# Patient Record
Sex: Female | Born: 1945 | Race: White | Hispanic: No | Marital: Married | Smoking: Never smoker
Health system: Southern US, Community
[De-identification: ages and names within clinical notes are randomized; demographics above are authoritative.]

## PROBLEM LIST (undated history)

## (undated) DIAGNOSIS — E8809 Other disorders of plasma-protein metabolism, not elsewhere classified: Secondary | ICD-10-CM

## (undated) DIAGNOSIS — R578 Other shock: Secondary | ICD-10-CM

## (undated) DIAGNOSIS — R740 Nonspecific elevation of levels of transaminase and lactic acid dehydrogenase [LDH]: Secondary | ICD-10-CM

## (undated) DIAGNOSIS — R7401 Elevation of levels of liver transaminase levels: Secondary | ICD-10-CM

## (undated) DIAGNOSIS — E785 Hyperlipidemia, unspecified: Secondary | ICD-10-CM

## (undated) DIAGNOSIS — I10 Essential (primary) hypertension: Secondary | ICD-10-CM

## (undated) DIAGNOSIS — E876 Hypokalemia: Secondary | ICD-10-CM

## (undated) DIAGNOSIS — R748 Abnormal levels of other serum enzymes: Secondary | ICD-10-CM

## (undated) DIAGNOSIS — Z8781 Personal history of (healed) traumatic fracture: Secondary | ICD-10-CM

## (undated) DIAGNOSIS — T7840XA Allergy, unspecified, initial encounter: Secondary | ICD-10-CM

## (undated) DIAGNOSIS — I6529 Occlusion and stenosis of unspecified carotid artery: Secondary | ICD-10-CM

## (undated) DIAGNOSIS — J45909 Unspecified asthma, uncomplicated: Secondary | ICD-10-CM

## (undated) DIAGNOSIS — B009 Herpesviral infection, unspecified: Secondary | ICD-10-CM

## (undated) DIAGNOSIS — E039 Hypothyroidism, unspecified: Secondary | ICD-10-CM

## (undated) DIAGNOSIS — M549 Dorsalgia, unspecified: Secondary | ICD-10-CM

## (undated) DIAGNOSIS — M2170 Unequal limb length (acquired), unspecified site: Secondary | ICD-10-CM

## (undated) DIAGNOSIS — M810 Age-related osteoporosis without current pathological fracture: Secondary | ICD-10-CM

## (undated) DIAGNOSIS — J449 Chronic obstructive pulmonary disease, unspecified: Secondary | ICD-10-CM

## (undated) DIAGNOSIS — C801 Malignant (primary) neoplasm, unspecified: Secondary | ICD-10-CM

## (undated) DIAGNOSIS — F419 Anxiety disorder, unspecified: Secondary | ICD-10-CM

## (undated) DIAGNOSIS — H269 Unspecified cataract: Secondary | ICD-10-CM

## (undated) DIAGNOSIS — K219 Gastro-esophageal reflux disease without esophagitis: Secondary | ICD-10-CM

## (undated) DIAGNOSIS — R091 Pleurisy: Secondary | ICD-10-CM

## (undated) DIAGNOSIS — M25551 Pain in right hip: Secondary | ICD-10-CM

## (undated) DIAGNOSIS — D696 Thrombocytopenia, unspecified: Secondary | ICD-10-CM

## (undated) DIAGNOSIS — Z9889 Other specified postprocedural states: Secondary | ICD-10-CM

## (undated) DIAGNOSIS — M1990 Unspecified osteoarthritis, unspecified site: Secondary | ICD-10-CM

## (undated) DIAGNOSIS — R109 Unspecified abdominal pain: Secondary | ICD-10-CM

## (undated) DIAGNOSIS — G4700 Insomnia, unspecified: Secondary | ICD-10-CM

## (undated) DIAGNOSIS — K589 Irritable bowel syndrome without diarrhea: Secondary | ICD-10-CM

## (undated) HISTORY — DX: Hypothyroidism, unspecified: E03.9

## (undated) HISTORY — DX: Unspecified abdominal pain: R10.9

## (undated) HISTORY — DX: Herpesviral infection, unspecified: B00.9

## (undated) HISTORY — DX: Unspecified osteoarthritis, unspecified site: M19.90

## (undated) HISTORY — DX: Age-related osteoporosis without current pathological fracture: M81.0

## (undated) HISTORY — DX: Other disorders of plasma-protein metabolism, not elsewhere classified: E88.09

## (undated) HISTORY — DX: Unspecified cataract: H26.9

## (undated) HISTORY — DX: Pleurisy: R09.1

## (undated) HISTORY — DX: Chronic obstructive pulmonary disease, unspecified: J44.9

## (undated) HISTORY — DX: Anxiety disorder, unspecified: F41.9

## (undated) HISTORY — DX: Gastro-esophageal reflux disease without esophagitis: K21.9

## (undated) HISTORY — DX: Occlusion and stenosis of unspecified carotid artery: I65.29

## (undated) HISTORY — DX: Unequal limb length (acquired), unspecified site: M21.70

## (undated) HISTORY — DX: Abnormal levels of other serum enzymes: R74.8

## (undated) HISTORY — DX: Hypokalemia: E87.6

## (undated) HISTORY — DX: Insomnia, unspecified: G47.00

## (undated) HISTORY — DX: Unspecified asthma, uncomplicated: J45.909

## (undated) HISTORY — DX: Personal history of (healed) traumatic fracture: Z87.81

## (undated) HISTORY — DX: Pain in right hip: M25.551

## (undated) HISTORY — DX: Irritable bowel syndrome without diarrhea: K58.9

## (undated) HISTORY — DX: Thrombocytopenia, unspecified: D69.6

## (undated) HISTORY — DX: Elevation of levels of liver transaminase levels: R74.01

## (undated) HISTORY — DX: Hyperlipidemia, unspecified: E78.5

## (undated) HISTORY — DX: Nonspecific elevation of levels of transaminase and lactic acid dehydrogenase (ldh): R74.0

## (undated) HISTORY — DX: Allergy, unspecified, initial encounter: T78.40XA

## (undated) HISTORY — DX: Dorsalgia, unspecified: M54.9

## (undated) HISTORY — DX: Other shock: R57.8

## (undated) HISTORY — DX: Essential (primary) hypertension: I10

## (undated) HISTORY — DX: Other specified postprocedural states: Z98.890

---
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
## 1987-04-26 HISTORY — PX: CATARACT EXTRACTION: SUR2

## 2013-03-31 DIAGNOSIS — J209 Acute bronchitis, unspecified: Secondary | ICD-10-CM | POA: Diagnosis not present

## 2013-07-12 DIAGNOSIS — J449 Chronic obstructive pulmonary disease, unspecified: Secondary | ICD-10-CM | POA: Insufficient documentation

## 2013-07-12 DIAGNOSIS — I10 Essential (primary) hypertension: Secondary | ICD-10-CM | POA: Diagnosis not present

## 2013-07-12 DIAGNOSIS — E039 Hypothyroidism, unspecified: Secondary | ICD-10-CM | POA: Insufficient documentation

## 2013-07-12 DIAGNOSIS — M810 Age-related osteoporosis without current pathological fracture: Secondary | ICD-10-CM | POA: Diagnosis not present

## 2013-08-01 DIAGNOSIS — Z1231 Encounter for screening mammogram for malignant neoplasm of breast: Secondary | ICD-10-CM | POA: Diagnosis not present

## 2013-08-22 DIAGNOSIS — L821 Other seborrheic keratosis: Secondary | ICD-10-CM | POA: Diagnosis not present

## 2013-08-22 DIAGNOSIS — L909 Atrophic disorder of skin, unspecified: Secondary | ICD-10-CM | POA: Diagnosis not present

## 2013-08-22 DIAGNOSIS — W57XXXA Bitten or stung by nonvenomous insect and other nonvenomous arthropods, initial encounter: Secondary | ICD-10-CM | POA: Diagnosis not present

## 2013-08-22 DIAGNOSIS — T148 Other injury of unspecified body region: Secondary | ICD-10-CM | POA: Diagnosis not present

## 2013-08-22 DIAGNOSIS — L919 Hypertrophic disorder of the skin, unspecified: Secondary | ICD-10-CM | POA: Diagnosis not present

## 2013-08-22 DIAGNOSIS — L819 Disorder of pigmentation, unspecified: Secondary | ICD-10-CM | POA: Diagnosis not present

## 2013-08-22 DIAGNOSIS — L570 Actinic keratosis: Secondary | ICD-10-CM | POA: Diagnosis not present

## 2013-08-22 DIAGNOSIS — D485 Neoplasm of uncertain behavior of skin: Secondary | ICD-10-CM | POA: Diagnosis not present

## 2013-08-28 DIAGNOSIS — D696 Thrombocytopenia, unspecified: Secondary | ICD-10-CM | POA: Diagnosis not present

## 2013-08-28 DIAGNOSIS — Z79899 Other long term (current) drug therapy: Secondary | ICD-10-CM | POA: Diagnosis not present

## 2013-08-28 DIAGNOSIS — I10 Essential (primary) hypertension: Secondary | ICD-10-CM | POA: Diagnosis not present

## 2013-08-28 DIAGNOSIS — M810 Age-related osteoporosis without current pathological fracture: Secondary | ICD-10-CM | POA: Diagnosis not present

## 2013-09-09 DIAGNOSIS — R945 Abnormal results of liver function studies: Secondary | ICD-10-CM | POA: Diagnosis not present

## 2013-09-09 DIAGNOSIS — R7989 Other specified abnormal findings of blood chemistry: Secondary | ICD-10-CM | POA: Diagnosis not present

## 2013-09-17 ENCOUNTER — Ambulatory Visit: Payer: Self-pay | Admitting: Family Medicine

## 2013-09-17 DIAGNOSIS — R748 Abnormal levels of other serum enzymes: Secondary | ICD-10-CM | POA: Diagnosis not present

## 2013-09-17 DIAGNOSIS — R7401 Elevation of levels of liver transaminase levels: Secondary | ICD-10-CM | POA: Diagnosis not present

## 2013-09-17 DIAGNOSIS — R7402 Elevation of levels of lactic acid dehydrogenase (LDH): Secondary | ICD-10-CM | POA: Diagnosis not present

## 2013-10-01 DIAGNOSIS — I10 Essential (primary) hypertension: Secondary | ICD-10-CM | POA: Diagnosis not present

## 2013-10-01 DIAGNOSIS — D696 Thrombocytopenia, unspecified: Secondary | ICD-10-CM | POA: Diagnosis not present

## 2013-10-08 DIAGNOSIS — E785 Hyperlipidemia, unspecified: Secondary | ICD-10-CM | POA: Diagnosis not present

## 2013-10-08 DIAGNOSIS — Z01419 Encounter for gynecological examination (general) (routine) without abnormal findings: Secondary | ICD-10-CM | POA: Diagnosis not present

## 2013-10-08 DIAGNOSIS — Z0000 Encounter for general adult medical examination without abnormal findings: Secondary | ICD-10-CM | POA: Diagnosis not present

## 2013-10-08 DIAGNOSIS — R9431 Abnormal electrocardiogram [ECG] [EKG]: Secondary | ICD-10-CM | POA: Diagnosis not present

## 2013-10-18 DIAGNOSIS — R9431 Abnormal electrocardiogram [ECG] [EKG]: Secondary | ICD-10-CM | POA: Insufficient documentation

## 2013-10-28 DIAGNOSIS — J449 Chronic obstructive pulmonary disease, unspecified: Secondary | ICD-10-CM | POA: Diagnosis not present

## 2013-10-28 DIAGNOSIS — R0602 Shortness of breath: Secondary | ICD-10-CM | POA: Insufficient documentation

## 2013-10-28 DIAGNOSIS — K219 Gastro-esophageal reflux disease without esophagitis: Secondary | ICD-10-CM | POA: Diagnosis not present

## 2013-10-28 DIAGNOSIS — R9431 Abnormal electrocardiogram [ECG] [EKG]: Secondary | ICD-10-CM | POA: Diagnosis not present

## 2013-10-28 DIAGNOSIS — I10 Essential (primary) hypertension: Secondary | ICD-10-CM | POA: Diagnosis not present

## 2013-11-04 DIAGNOSIS — R7402 Elevation of levels of lactic acid dehydrogenase (LDH): Secondary | ICD-10-CM | POA: Diagnosis not present

## 2013-11-04 DIAGNOSIS — R7401 Elevation of levels of liver transaminase levels: Secondary | ICD-10-CM | POA: Diagnosis not present

## 2013-11-06 DIAGNOSIS — K219 Gastro-esophageal reflux disease without esophagitis: Secondary | ICD-10-CM | POA: Diagnosis not present

## 2013-11-06 DIAGNOSIS — I10 Essential (primary) hypertension: Secondary | ICD-10-CM | POA: Diagnosis not present

## 2013-11-06 DIAGNOSIS — J449 Chronic obstructive pulmonary disease, unspecified: Secondary | ICD-10-CM | POA: Diagnosis not present

## 2013-11-06 DIAGNOSIS — R0602 Shortness of breath: Secondary | ICD-10-CM | POA: Diagnosis not present

## 2013-11-06 DIAGNOSIS — R9431 Abnormal electrocardiogram [ECG] [EKG]: Secondary | ICD-10-CM | POA: Diagnosis not present

## 2013-11-23 DIAGNOSIS — R578 Other shock: Secondary | ICD-10-CM

## 2013-11-23 DIAGNOSIS — Z9889 Other specified postprocedural states: Secondary | ICD-10-CM

## 2013-11-23 HISTORY — DX: Other specified postprocedural states: Z98.890

## 2013-11-23 HISTORY — DX: Other shock: R57.8

## 2013-12-02 DIAGNOSIS — R7402 Elevation of levels of lactic acid dehydrogenase (LDH): Secondary | ICD-10-CM | POA: Diagnosis not present

## 2013-12-02 DIAGNOSIS — R7401 Elevation of levels of liver transaminase levels: Secondary | ICD-10-CM | POA: Diagnosis not present

## 2013-12-04 DIAGNOSIS — R7402 Elevation of levels of lactic acid dehydrogenase (LDH): Secondary | ICD-10-CM | POA: Diagnosis not present

## 2013-12-11 ENCOUNTER — Inpatient Hospital Stay: Payer: Self-pay | Admitting: Internal Medicine

## 2013-12-11 DIAGNOSIS — IMO0002 Reserved for concepts with insufficient information to code with codable children: Secondary | ICD-10-CM | POA: Diagnosis present

## 2013-12-11 DIAGNOSIS — N9489 Other specified conditions associated with female genital organs and menstrual cycle: Secondary | ICD-10-CM | POA: Diagnosis not present

## 2013-12-11 DIAGNOSIS — I10 Essential (primary) hypertension: Secondary | ICD-10-CM | POA: Diagnosis present

## 2013-12-11 DIAGNOSIS — Z886 Allergy status to analgesic agent status: Secondary | ICD-10-CM | POA: Diagnosis not present

## 2013-12-11 DIAGNOSIS — K219 Gastro-esophageal reflux disease without esophagitis: Secondary | ICD-10-CM | POA: Diagnosis not present

## 2013-12-11 DIAGNOSIS — E039 Hypothyroidism, unspecified: Secondary | ICD-10-CM | POA: Diagnosis present

## 2013-12-11 DIAGNOSIS — K7689 Other specified diseases of liver: Secondary | ICD-10-CM | POA: Diagnosis not present

## 2013-12-11 DIAGNOSIS — D62 Acute posthemorrhagic anemia: Secondary | ICD-10-CM | POA: Diagnosis not present

## 2013-12-11 DIAGNOSIS — R7989 Other specified abnormal findings of blood chemistry: Secondary | ICD-10-CM | POA: Diagnosis not present

## 2013-12-11 DIAGNOSIS — I959 Hypotension, unspecified: Secondary | ICD-10-CM | POA: Diagnosis not present

## 2013-12-11 DIAGNOSIS — R578 Other shock: Secondary | ICD-10-CM | POA: Diagnosis not present

## 2013-12-11 DIAGNOSIS — E860 Dehydration: Secondary | ICD-10-CM | POA: Diagnosis present

## 2013-12-11 DIAGNOSIS — S36112A Contusion of liver, initial encounter: Secondary | ICD-10-CM | POA: Diagnosis not present

## 2013-12-11 DIAGNOSIS — J449 Chronic obstructive pulmonary disease, unspecified: Secondary | ICD-10-CM | POA: Diagnosis not present

## 2013-12-11 DIAGNOSIS — E876 Hypokalemia: Secondary | ICD-10-CM | POA: Diagnosis not present

## 2013-12-11 DIAGNOSIS — J4489 Other specified chronic obstructive pulmonary disease: Secondary | ICD-10-CM | POA: Diagnosis not present

## 2013-12-11 LAB — CBC WITH DIFFERENTIAL/PLATELET
Basophil #: 0.1 10*3/uL (ref 0.0–0.1)
Basophil %: 1.8 %
Eosinophil #: 0.1 10*3/uL (ref 0.0–0.7)
Eosinophil %: 1.5 %
HCT: 40 % (ref 35.0–47.0)
HGB: 13.1 g/dL (ref 12.0–16.0)
LYMPHS PCT: 21.7 %
Lymphocyte #: 1.5 10*3/uL (ref 1.0–3.6)
MCH: 32.9 pg (ref 26.0–34.0)
MCHC: 32.7 g/dL (ref 32.0–36.0)
MCV: 101 fL — AB (ref 80–100)
Monocyte #: 0.7 x10 3/mm (ref 0.2–0.9)
Monocyte %: 10.7 %
NEUTROS ABS: 4.5 10*3/uL (ref 1.4–6.5)
Neutrophil %: 64.3 %
PLATELETS: 82 10*3/uL — AB (ref 150–440)
RBC: 3.98 10*6/uL (ref 3.80–5.20)
RDW: 14.5 % (ref 11.5–14.5)
WBC: 6.9 10*3/uL (ref 3.6–11.0)

## 2013-12-11 LAB — PROTIME-INR
INR: 1
PROTHROMBIN TIME: 12.6 s (ref 11.5–14.7)

## 2013-12-11 LAB — BASIC METABOLIC PANEL
ANION GAP: 10 (ref 7–16)
BUN: 20 mg/dL — AB (ref 7–18)
CO2: 26 mmol/L (ref 21–32)
CREATININE: 1.14 mg/dL (ref 0.60–1.30)
Calcium, Total: 8.5 mg/dL (ref 8.5–10.1)
Chloride: 104 mmol/L (ref 98–107)
EGFR (Non-African Amer.): 50 — ABNORMAL LOW
GFR CALC AF AMER: 58 — AB
Glucose: 99 mg/dL (ref 65–99)
OSMOLALITY: 282 (ref 275–301)
Potassium: 3.4 mmol/L — ABNORMAL LOW (ref 3.5–5.1)
Sodium: 140 mmol/L (ref 136–145)

## 2013-12-11 LAB — HEMOGLOBIN
HGB: 8.5 g/dL — ABNORMAL LOW (ref 12.0–16.0)
HGB: 11 g/dL — ABNORMAL LOW (ref 12.0–16.0)

## 2013-12-11 LAB — HEMATOCRIT: HCT: 26 % — AB (ref 35.0–47.0)

## 2013-12-12 LAB — CBC WITH DIFFERENTIAL/PLATELET
BASOS ABS: 0 10*3/uL (ref 0.0–0.1)
Basophil %: 0.3 %
Eosinophil #: 0 10*3/uL (ref 0.0–0.7)
Eosinophil %: 0.4 %
HCT: 31.6 % — AB (ref 35.0–47.0)
HGB: 10.6 g/dL — AB (ref 12.0–16.0)
LYMPHS ABS: 0.7 10*3/uL — AB (ref 1.0–3.6)
Lymphocyte %: 6.6 %
MCH: 32.6 pg (ref 26.0–34.0)
MCHC: 33.4 g/dL (ref 32.0–36.0)
MCV: 98 fL (ref 80–100)
MONO ABS: 0.7 x10 3/mm (ref 0.2–0.9)
Monocyte %: 6.7 %
Neutrophil #: 9.5 10*3/uL — ABNORMAL HIGH (ref 1.4–6.5)
Neutrophil %: 86 %
PLATELETS: 63 10*3/uL — AB (ref 150–440)
RBC: 3.24 10*6/uL — ABNORMAL LOW (ref 3.80–5.20)
RDW: 15.4 % — ABNORMAL HIGH (ref 11.5–14.5)
WBC: 11 10*3/uL (ref 3.6–11.0)

## 2013-12-12 LAB — BASIC METABOLIC PANEL
ANION GAP: 9 (ref 7–16)
BUN: 11 mg/dL (ref 7–18)
CALCIUM: 7.8 mg/dL — AB (ref 8.5–10.1)
CO2: 26 mmol/L (ref 21–32)
CREATININE: 0.89 mg/dL (ref 0.60–1.30)
Chloride: 109 mmol/L — ABNORMAL HIGH (ref 98–107)
EGFR (African American): >60
EGFR (Non-African Amer.): >60
GLUCOSE: 128 mg/dL — AB (ref 65–99)
Osmolality: 288 (ref 275–301)
Potassium: 3.1 mmol/L — ABNORMAL LOW (ref 3.5–5.1)
Sodium: 144 mmol/L (ref 136–145)

## 2013-12-12 LAB — HEPATIC FUNCTION PANEL A (ARMC)
AST: 33 U/L (ref 15–37)
Albumin: 3.2 g/dL — ABNORMAL LOW (ref 3.4–5.0)
Alkaline Phosphatase: 105 U/L
BILIRUBIN TOTAL: 0.8 mg/dL (ref 0.2–1.0)
Bilirubin, Direct: 0.2 mg/dL (ref 0.00–0.20)
SGPT (ALT): 32 U/L
Total Protein: 5.5 g/dL — ABNORMAL LOW (ref 6.4–8.2)

## 2013-12-12 LAB — MAGNESIUM: Magnesium: 1.5 mg/dL — ABNORMAL LOW

## 2013-12-12 LAB — HEMOGLOBIN: HGB: 11 g/dL — ABNORMAL LOW (ref 12.0–16.0)

## 2013-12-12 LAB — TROPONIN I: Troponin-I: <0.02 ng/mL

## 2013-12-12 LAB — CK-MB: CK-MB: 0.7 ng/mL (ref 0.5–3.6)

## 2013-12-16 DIAGNOSIS — D649 Anemia, unspecified: Secondary | ICD-10-CM | POA: Diagnosis not present

## 2013-12-16 DIAGNOSIS — E876 Hypokalemia: Secondary | ICD-10-CM | POA: Diagnosis not present

## 2013-12-17 LAB — PATHOLOGY REPORT

## 2013-12-23 DIAGNOSIS — Z91040 Latex allergy status: Secondary | ICD-10-CM | POA: Diagnosis not present

## 2013-12-23 DIAGNOSIS — R079 Chest pain, unspecified: Secondary | ICD-10-CM | POA: Diagnosis not present

## 2013-12-23 DIAGNOSIS — Z888 Allergy status to other drugs, medicaments and biological substances status: Secondary | ICD-10-CM | POA: Diagnosis not present

## 2013-12-23 DIAGNOSIS — Z9089 Acquired absence of other organs: Secondary | ICD-10-CM | POA: Diagnosis not present

## 2013-12-23 DIAGNOSIS — E039 Hypothyroidism, unspecified: Secondary | ICD-10-CM | POA: Diagnosis not present

## 2013-12-23 DIAGNOSIS — R7401 Elevation of levels of liver transaminase levels: Secondary | ICD-10-CM | POA: Diagnosis not present

## 2013-12-23 DIAGNOSIS — Z8489 Family history of other specified conditions: Secondary | ICD-10-CM | POA: Diagnosis not present

## 2013-12-23 DIAGNOSIS — R1011 Right upper quadrant pain: Secondary | ICD-10-CM | POA: Diagnosis not present

## 2013-12-23 DIAGNOSIS — Z79899 Other long term (current) drug therapy: Secondary | ICD-10-CM | POA: Diagnosis not present

## 2013-12-23 DIAGNOSIS — D696 Thrombocytopenia, unspecified: Secondary | ICD-10-CM | POA: Diagnosis not present

## 2013-12-23 DIAGNOSIS — K5289 Other specified noninfective gastroenteritis and colitis: Secondary | ICD-10-CM | POA: Diagnosis not present

## 2013-12-23 DIAGNOSIS — D500 Iron deficiency anemia secondary to blood loss (chronic): Secondary | ICD-10-CM | POA: Diagnosis not present

## 2013-12-23 DIAGNOSIS — R319 Hematuria, unspecified: Secondary | ICD-10-CM | POA: Diagnosis not present

## 2013-12-23 DIAGNOSIS — J449 Chronic obstructive pulmonary disease, unspecified: Secondary | ICD-10-CM | POA: Diagnosis not present

## 2013-12-23 DIAGNOSIS — R0602 Shortness of breath: Secondary | ICD-10-CM | POA: Diagnosis not present

## 2013-12-23 DIAGNOSIS — Z885 Allergy status to narcotic agent status: Secondary | ICD-10-CM | POA: Diagnosis not present

## 2013-12-23 DIAGNOSIS — N390 Urinary tract infection, site not specified: Secondary | ICD-10-CM | POA: Diagnosis not present

## 2013-12-23 DIAGNOSIS — IMO0002 Reserved for concepts with insufficient information to code with codable children: Secondary | ICD-10-CM | POA: Diagnosis not present

## 2013-12-23 DIAGNOSIS — Z8719 Personal history of other diseases of the digestive system: Secondary | ICD-10-CM | POA: Diagnosis not present

## 2013-12-23 DIAGNOSIS — I10 Essential (primary) hypertension: Secondary | ICD-10-CM | POA: Diagnosis not present

## 2013-12-23 DIAGNOSIS — K7689 Other specified diseases of liver: Secondary | ICD-10-CM | POA: Diagnosis not present

## 2013-12-23 DIAGNOSIS — K5000 Crohn's disease of small intestine without complications: Secondary | ICD-10-CM | POA: Diagnosis not present

## 2013-12-23 DIAGNOSIS — Z8679 Personal history of other diseases of the circulatory system: Secondary | ICD-10-CM | POA: Diagnosis not present

## 2013-12-24 DIAGNOSIS — K5289 Other specified noninfective gastroenteritis and colitis: Secondary | ICD-10-CM | POA: Diagnosis not present

## 2013-12-24 DIAGNOSIS — D649 Anemia, unspecified: Secondary | ICD-10-CM | POA: Diagnosis not present

## 2013-12-24 DIAGNOSIS — E039 Hypothyroidism, unspecified: Secondary | ICD-10-CM | POA: Diagnosis not present

## 2013-12-24 DIAGNOSIS — K7689 Other specified diseases of liver: Secondary | ICD-10-CM | POA: Diagnosis not present

## 2013-12-24 DIAGNOSIS — K5000 Crohn's disease of small intestine without complications: Secondary | ICD-10-CM | POA: Diagnosis not present

## 2013-12-25 DIAGNOSIS — E039 Hypothyroidism, unspecified: Secondary | ICD-10-CM | POA: Diagnosis not present

## 2013-12-25 DIAGNOSIS — K5289 Other specified noninfective gastroenteritis and colitis: Secondary | ICD-10-CM | POA: Diagnosis not present

## 2013-12-25 DIAGNOSIS — D649 Anemia, unspecified: Secondary | ICD-10-CM | POA: Diagnosis not present

## 2013-12-25 DIAGNOSIS — K7689 Other specified diseases of liver: Secondary | ICD-10-CM | POA: Diagnosis not present

## 2014-01-06 DIAGNOSIS — Z23 Encounter for immunization: Secondary | ICD-10-CM | POA: Diagnosis not present

## 2014-01-06 DIAGNOSIS — K7689 Other specified diseases of liver: Secondary | ICD-10-CM | POA: Diagnosis not present

## 2014-01-06 DIAGNOSIS — K5289 Other specified noninfective gastroenteritis and colitis: Secondary | ICD-10-CM | POA: Diagnosis not present

## 2014-01-14 DIAGNOSIS — R933 Abnormal findings on diagnostic imaging of other parts of digestive tract: Secondary | ICD-10-CM | POA: Diagnosis not present

## 2014-01-16 DIAGNOSIS — R933 Abnormal findings on diagnostic imaging of other parts of digestive tract: Secondary | ICD-10-CM | POA: Diagnosis not present

## 2014-02-27 DIAGNOSIS — E782 Mixed hyperlipidemia: Secondary | ICD-10-CM | POA: Insufficient documentation

## 2014-02-27 DIAGNOSIS — J449 Chronic obstructive pulmonary disease, unspecified: Secondary | ICD-10-CM | POA: Diagnosis not present

## 2014-02-27 DIAGNOSIS — R0602 Shortness of breath: Secondary | ICD-10-CM | POA: Diagnosis not present

## 2014-02-27 DIAGNOSIS — I10 Essential (primary) hypertension: Secondary | ICD-10-CM | POA: Diagnosis not present

## 2014-04-01 ENCOUNTER — Ambulatory Visit: Payer: Self-pay

## 2014-04-01 DIAGNOSIS — J984 Other disorders of lung: Secondary | ICD-10-CM | POA: Diagnosis not present

## 2014-04-01 DIAGNOSIS — J40 Bronchitis, not specified as acute or chronic: Secondary | ICD-10-CM | POA: Diagnosis not present

## 2014-04-01 DIAGNOSIS — H209 Unspecified iridocyclitis: Secondary | ICD-10-CM | POA: Diagnosis not present

## 2014-04-01 DIAGNOSIS — J449 Chronic obstructive pulmonary disease, unspecified: Secondary | ICD-10-CM | POA: Diagnosis not present

## 2014-04-01 DIAGNOSIS — R05 Cough: Secondary | ICD-10-CM | POA: Diagnosis not present

## 2014-04-01 DIAGNOSIS — T859XXA Unspecified complication of internal prosthetic device, implant and graft, initial encounter: Secondary | ICD-10-CM | POA: Diagnosis not present

## 2014-04-07 DIAGNOSIS — H209 Unspecified iridocyclitis: Secondary | ICD-10-CM | POA: Diagnosis not present

## 2014-04-15 DIAGNOSIS — J441 Chronic obstructive pulmonary disease with (acute) exacerbation: Secondary | ICD-10-CM | POA: Diagnosis not present

## 2014-04-21 DIAGNOSIS — H209 Unspecified iridocyclitis: Secondary | ICD-10-CM | POA: Diagnosis not present

## 2014-05-02 DIAGNOSIS — J441 Chronic obstructive pulmonary disease with (acute) exacerbation: Secondary | ICD-10-CM | POA: Diagnosis not present

## 2014-05-02 DIAGNOSIS — M7581 Other shoulder lesions, right shoulder: Secondary | ICD-10-CM | POA: Diagnosis not present

## 2014-05-06 DIAGNOSIS — H209 Unspecified iridocyclitis: Secondary | ICD-10-CM | POA: Diagnosis not present

## 2014-05-21 DIAGNOSIS — J449 Chronic obstructive pulmonary disease, unspecified: Secondary | ICD-10-CM | POA: Diagnosis not present

## 2014-05-21 DIAGNOSIS — R0609 Other forms of dyspnea: Secondary | ICD-10-CM | POA: Diagnosis not present

## 2014-05-21 DIAGNOSIS — R05 Cough: Secondary | ICD-10-CM | POA: Diagnosis not present

## 2014-06-17 DIAGNOSIS — J449 Chronic obstructive pulmonary disease, unspecified: Secondary | ICD-10-CM | POA: Diagnosis not present

## 2014-06-17 DIAGNOSIS — E039 Hypothyroidism, unspecified: Secondary | ICD-10-CM | POA: Diagnosis not present

## 2014-06-17 DIAGNOSIS — M810 Age-related osteoporosis without current pathological fracture: Secondary | ICD-10-CM | POA: Diagnosis not present

## 2014-06-17 DIAGNOSIS — I10 Essential (primary) hypertension: Secondary | ICD-10-CM | POA: Diagnosis not present

## 2014-06-17 DIAGNOSIS — D696 Thrombocytopenia, unspecified: Secondary | ICD-10-CM | POA: Diagnosis not present

## 2014-06-17 DIAGNOSIS — E785 Hyperlipidemia, unspecified: Secondary | ICD-10-CM | POA: Diagnosis not present

## 2014-06-17 LAB — TSH: TSH: 1.14 u[IU]/mL (ref 0.41–5.90)

## 2014-08-14 ENCOUNTER — Other Ambulatory Visit: Payer: Self-pay | Admitting: Family Medicine

## 2014-08-14 DIAGNOSIS — N63 Unspecified lump in unspecified breast: Secondary | ICD-10-CM

## 2014-08-14 DIAGNOSIS — D696 Thrombocytopenia, unspecified: Secondary | ICD-10-CM | POA: Diagnosis not present

## 2014-08-14 DIAGNOSIS — R109 Unspecified abdominal pain: Secondary | ICD-10-CM | POA: Diagnosis not present

## 2014-08-16 NOTE — H&P (Signed)
PATIENT NAME:  Anna Lowe, Anna Lowe MR#:  [id number] DATE OF BIRTH:  12-Aug-1945  DATE OF ADMISSION:  12/11/2013  PRIMARY CARE PHYSICIAN: Enid Derry, MD    CHIEF COMPLAINT: Hypotension.   HISTORY OF PRESENT ILLNESS: This is a 69 year old female who presented to the hospital for an elective ultrasound-guided liver biopsy. Post liver biopsy. The patient was noted to be acutely hypotensive with systolic blood pressures in the 80s. She was given 2 L of IV fluids and continued to be hypotensive. The patient underwent a CT scan of the abdomen and pelvis, which showed a small subcapsular hematoma near her biopsy site. As per interventional radiology, they also did an angiogram, which showed no evidence of acute bleeding. She is being admitted under observation due to acute hypotension, although clinically she is asymptomatic. The patient denies any chest pain, shortness of breath, nausea, vomiting, abdominal pain, fevers, chills, cough or any other associated symptoms presently.   REVIEW OF SYSTEMS:  CONSTITUTIONAL: No documented fever. No weight gain or weight loss.  EYES: No blurred or double vision.  EARS, NOSE, AND THROAT: No tinnitus. No postnasal drip. No redness of the oropharynx.  RESPIRATORY: No cough, no wheeze, no hemoptysis, no dyspnea.  CARDIOVASCULAR: No chest pain. No orthopnea. No palpitations. No syncope.  GASTROINTESTINAL: No nausea, no vomiting, no diarrhea, no abdominal pain, no melena or hematochezia.  GENITOURINARY: No dysuria and hematuria.  ENDOCRINE: No polyuria, nocturia,  heat or cold intolerance.  HEMATOLOGIC: No anemia, no bruising, no bleeding.  INTEGUMENTARY: No rashes or lesions.  MUSCULOSKELETAL: No arthritis. No swelling. No gout.  NEUROLOGIC: No numbness or tingling. No ataxia. No seizure-type activity.  PSYCHIATRIC: No anxiety, no insomnia. No ADD.   PAST MEDICAL HISTORY: Consistent with hypertension, COPD, GERD, hypothyroidism.   ALLERGIES: ASPIRIN, BACITRACIN,  CODEINE AND LATEX.   SOCIAL HISTORY: No smoking. No alcohol abuse. No illicit drug abuse. Lives at home with her husband.   FAMILY HISTORY: Mother and father both deceased. Father died from emphysema. Mother died from a blockage in her intestine.   CURRENT MEDICATIONS: As follows: Advair 250/50 1 puff b.i.d., Klonopin 0.5 mg b.i.d., hydrochlorothiazide/valsartan 25/160, 1 tablet daily; omeprazole 20 mg daily, Synthroid 75 mcg daily, and albuterol inhaler 2 puffs 4 times daily as needed.   PHYSICAL EXAMINATION:  Presently is as follows:  VITAL SIGNS: Temperature: She is afebrile. Pulse in the 80s. Respirations 18. Blood pressure 90/50. Saturating 100% on room air.  GENERAL: She is a pleasant-appearing female, lethargic, but in no apparent distress.  HEAD, EYES, EARS, NOSE, THROAT: Atraumatic, normocephalic. Extraocular muscles are intact. Pupils reactive to light. Sclerae anicteric. No conjunctival injection. No pharyngeal erythema.  NECK: Supple. There is no jugular venous distention. No bruits, no lymphadenopathy, no thyromegaly.  HEART: Regular rate and rhythm. No murmurs, no rubs, no clicks.  LUNGS: Clear to auscultation bilaterally. No rales, no rhonchi, no wheezes.  ABDOMEN: Soft, flat, nontender, nondistended. Has good bowel sounds. No hepatosplenomegaly appreciated.  EXTREMITIES: No evidence of any cyanosis, clubbing or peripheral edema. Has +2 pedal and radial pulses bilaterally.  NEUROLOGICAL: The patient is alert, awake and oriented x3, with no focal motor or sensory deficits appreciated bilaterally.  SKIN: Moist and warm, with no rashes appreciated.  LYMPHATIC: There is no cervical or axillary lymphadenopathy.   LABORATORY DATA: Serum glucose of 99, BUN 20, creatinine 1.14, sodium 140, potassium 3.4, chloride 104, bicarbonate 36. The patient's white cell count is 6.9, hemoglobin 13.1, hematocrit 40.0, platelet count of 82,000. INR is  1.0.   The patient did have a CT of the pelvis  done without contrast, which showed small to moderate sized subcapsular hematoma with trace amount of hemorrhage seen within the lower pelvis following ultrasound-guided liver biopsy.   ASSESSMENT AND PLAN: This is a 69 year old female with history of chronic obstructive pulmonary disease, hypertension, gastroesophageal reflux disease and hypothyroidism, who presented to the hospital due to ultrasound-guided liver biopsy. Post biopsy, the patient was noted to be hypotensive, and A CT abdomen and pelvis showing some small to moderate subcapsular hematoma.  1.  Hypotension. I suspect this is secondary to dehydration, and also volume loss due to a small to moderate subcapsular hematoma. For now, we will give her aggressive IV fluids, follow hemodynamics hold antihypertensives for now, follow her serial hemoglobins; currently it is stable.  2.  Subcapsular hematoma. This is likely a complication secondary to the ultrasound-guided liver biopsy. As per interventional radiology, they did perform an angiogram post CT scan results, which showed no evidence of acute bleeding or hemorrhage. For now, we will continue IV fluids, follow serial hemoglobins. Continue care as per interventional radiology.  3.  Gastroesophageal reflux disease. Continue Protonix.  4.  Hypothyroidism; continue Synthroid.  5.  Chronic obstructive pulmonary disease, no acute exacerbation. Continue Advair and p.r.n. albuterol.   CODE STATUS: The patient is a Full Code.   TIME SPENT ON ADMISSION: 50 minutes    ____________________________ Belia Heman. Verdell Carmine, MD vjs:MT D: 12/11/2013 13:01:55 ET T: 12/11/2013 13:27:31 ET JOB#: [id number]  cc: Belia Heman. Verdell Carmine, MD, <Dictator> Henreitta Leber MD ELECTRONICALLY SIGNED 12/21/2013 11:46

## 2014-08-16 NOTE — Discharge Summary (Signed)
PATIENT NAME:  Anna Lowe, Anna Lowe MR#:  [id number] DATE OF BIRTH:  [birth year]  DATE OF ADMISSION:  12/11/2013 DATE OF DISCHARGE:  12/13/2013  DISCHARGE DIAGNOSES: 1.  Acute hemorrhagic shock, now resolved, likely due to acute blood loss anemia from small to moderate subcapsular hematoma.  2.  Acute blood loss anemia, likely due to subcapsular hematoma and some blood loss around site of biopsy, status post 1 unit of packed red blood cell transfusion and now hemodynamically stable.  3.  Hypokalemia, repleted and resolved.   SECONDARY DIAGNOSES: 1.  Hypertension.  2.  Chronic obstructive pulmonary disease. 3.  Gastroesophageal reflux disease. 4.  Hypothyroidism.   CONSULTATIONS: None.   PROCEDURES AND RADIOLOGY: CT scan of the abdomen and pelvis without contrast on 19th of August showed small to moderate sized subcapsular hematoma with trace amount of hemorrhage seen within the lower pelvis following ultrasound-guided liver biopsy.   Ultrasound-guided liver biopsy on 19th of August.   Visceral angiography on 19th of August showed no definite persistent areas of vessel irregularity or contrast extravasation.   HISTORY AND SHORT HOSPITAL COURSE: The patient is a 69 year old female with the above-mentioned medical problems who was admitted for hypotension/acute hemorrhagic shock thought to be secondary from acute blood loss anemia from small to moderate subcapsular hematoma at the site of biopsy of the liver. Please see Dr. Zigmund Gottron dictated history and physical for further details. The patient was monitored in the CCU, required 1 unit of packed red blood cell transfusion due to acute blood loss anemia after which she was hemodynamically stable. Her blood pressure medications were held. She was kept on intravenous pressors and was slowly weaned off. She had some electrolyte disturbances which were repleted and resolved. She was feeling much better, been off pressors for the last 24 hours and  has been maintaining her pressure. It was close to her baseline and was discharged home in stable condition.   PERTINENT DISCHARGE PHYSICAL EXAMINATION: VITAL SIGNS: On the date of discharge, her temperature is 98.2, heart rate 85 per minute, respirations 20 per minute, blood pressure 133/72 mmHg, and she is saturating 96% on room air.  CARDIOVASCULAR: S1, S2 normal. No murmurs, rubs, or gallops.  LUNGS: Clear to auscultation bilaterally. No wheezing, rales, rhonchi, or crepitation.  ABDOMEN: Soft, benign.  NEUROLOGIC: Nonfocal examination. All other physical examination remained at baseline.   DISCHARGE MEDICATIONS: 1.  Advair 250/50 one puff b.i.d.  2.  Clonazepam 0.5 mg p.o. b.i.d.  3.  Omeprazole 20 mg p.o. daily.  4.  Synthroid 75 mcg p.o. daily. 5.  Ventolin 2 puffs inhaled 4 times a day.  6.  Spiriva once daily.  7.  HCTZ/valsartan 25/160 mg once daily, has to be held until she follows up with her primary care physician. This was very clearly mentioned to the patient that she should not be taking any blood pressure medication considering her hypotension until she follows up with her primary care physician.   DISCHARGE DIET: Regular.   DISCHARGE ACTIVITY: As tolerated.   DISCHARGE INSTRUCTIONS AND FOLLOW-UP: The patient was instructed to follow up with her primary care physician, Dr. Enid Derry, in 1 to 2 weeks.   TOTAL TIME DISCHARGING THIS PATIENT: 45 minutes.  ____________________________ Lucina Mellow. Manuella Ghazi, MD vss:sb D: 12/13/2013 12:08:11 ET T: 12/13/2013 13:17:00 ET JOB#: [id number]  cc: Terelle Dobler S. Manuella Ghazi, MD, <Dictator> Enid Derry, MD Lucina Mellow [facility] MD ELECTRONICALLY SIGNED 12/16/2013 22:42

## 2014-08-18 ENCOUNTER — Ambulatory Visit
Admit: 2014-08-18 | Disposition: A | Discharge status: Home / Self Care | Payer: Self-pay | Attending: Family Medicine | Admitting: Family Medicine

## 2014-08-18 DIAGNOSIS — D696 Thrombocytopenia, unspecified: Secondary | ICD-10-CM | POA: Diagnosis not present

## 2014-08-18 DIAGNOSIS — R1012 Left upper quadrant pain: Secondary | ICD-10-CM | POA: Diagnosis not present

## 2014-08-18 DIAGNOSIS — R935 Abnormal findings on diagnostic imaging of other abdominal regions, including retroperitoneum: Secondary | ICD-10-CM | POA: Diagnosis not present

## 2014-08-27 ENCOUNTER — Ambulatory Visit
Admission: RE | Admit: 2014-08-27 | Discharge: 2014-08-27 | Disposition: A | Discharge status: Home / Self Care | Payer: Medicare Other | Source: Ambulatory Visit | Attending: Family Medicine | Admitting: Family Medicine

## 2014-08-27 DIAGNOSIS — N63 Unspecified lump in unspecified breast: Secondary | ICD-10-CM

## 2014-08-27 DIAGNOSIS — R922 Inconclusive mammogram: Secondary | ICD-10-CM | POA: Diagnosis not present

## 2014-09-11 DIAGNOSIS — K760 Fatty (change of) liver, not elsewhere classified: Secondary | ICD-10-CM | POA: Diagnosis not present

## 2014-09-11 DIAGNOSIS — K529 Noninfective gastroenteritis and colitis, unspecified: Secondary | ICD-10-CM | POA: Diagnosis not present

## 2014-09-11 DIAGNOSIS — Z8719 Personal history of other diseases of the digestive system: Secondary | ICD-10-CM | POA: Diagnosis not present

## 2014-09-18 DIAGNOSIS — M549 Dorsalgia, unspecified: Secondary | ICD-10-CM | POA: Diagnosis not present

## 2014-09-25 ENCOUNTER — Ambulatory Visit
Admission: RE | Admit: 2014-09-25 | Discharge: 2014-09-25 | Disposition: A | Discharge status: Home / Self Care | Payer: Medicare Other | Source: Ambulatory Visit | Attending: Gastroenterology | Admitting: Gastroenterology

## 2014-09-25 ENCOUNTER — Ambulatory Visit: Payer: Medicare Other | Admitting: Anesthesiology

## 2014-09-25 ENCOUNTER — Encounter
Admission: RE | Disposition: A | Discharge status: Home / Self Care | Payer: Self-pay | Source: Ambulatory Visit | Attending: Gastroenterology

## 2014-09-25 DIAGNOSIS — Z886 Allergy status to analgesic agent status: Secondary | ICD-10-CM | POA: Diagnosis not present

## 2014-09-25 DIAGNOSIS — R0602 Shortness of breath: Secondary | ICD-10-CM | POA: Diagnosis not present

## 2014-09-25 DIAGNOSIS — E039 Hypothyroidism, unspecified: Secondary | ICD-10-CM | POA: Diagnosis not present

## 2014-09-25 DIAGNOSIS — Z885 Allergy status to narcotic agent status: Secondary | ICD-10-CM | POA: Diagnosis not present

## 2014-09-25 DIAGNOSIS — R062 Wheezing: Secondary | ICD-10-CM | POA: Diagnosis not present

## 2014-09-25 DIAGNOSIS — K6389 Other specified diseases of intestine: Secondary | ICD-10-CM | POA: Diagnosis not present

## 2014-09-25 DIAGNOSIS — D125 Benign neoplasm of sigmoid colon: Secondary | ICD-10-CM | POA: Diagnosis not present

## 2014-09-25 DIAGNOSIS — Z79899 Other long term (current) drug therapy: Secondary | ICD-10-CM | POA: Insufficient documentation

## 2014-09-25 DIAGNOSIS — Z91048 Other nonmedicinal substance allergy status: Secondary | ICD-10-CM | POA: Insufficient documentation

## 2014-09-25 DIAGNOSIS — K635 Polyp of colon: Secondary | ICD-10-CM | POA: Diagnosis not present

## 2014-09-25 DIAGNOSIS — Z7951 Long term (current) use of inhaled steroids: Secondary | ICD-10-CM | POA: Diagnosis not present

## 2014-09-25 DIAGNOSIS — J449 Chronic obstructive pulmonary disease, unspecified: Secondary | ICD-10-CM | POA: Diagnosis not present

## 2014-09-25 DIAGNOSIS — R933 Abnormal findings on diagnostic imaging of other parts of digestive tract: Secondary | ICD-10-CM | POA: Diagnosis present

## 2014-09-25 HISTORY — PX: COLONOSCOPY WITH PROPOFOL: SHX5780

## 2014-09-25 SURGERY — COLONOSCOPY WITH PROPOFOL
Anesthesia: General

## 2014-09-25 MED ORDER — SODIUM CHLORIDE 0.9 % IV SOLN: INTRAVENOUS | Status: DC

## 2014-09-25 MED ORDER — MIDAZOLAM HCL 2 MG/2ML IJ SOLN
INTRAMUSCULAR | Status: DC | PRN
  Administered 2014-09-25: 2 mg via INTRAVENOUS

## 2014-09-25 MED ORDER — SODIUM CHLORIDE 0.9 % IV SOLN
INTRAVENOUS | Status: DC
  Administered 2014-09-25: 10:00:00 via INTRAVENOUS

## 2014-09-25 MED ORDER — EPHEDRINE SULFATE 50 MG/ML IJ SOLN
INTRAMUSCULAR | Status: DC | PRN
  Administered 2014-09-25: 10 mg via INTRAVENOUS
  Administered 2014-09-25: 15 mg via INTRAVENOUS
  Administered 2014-09-25: 10 mg via INTRAVENOUS

## 2014-09-25 MED ORDER — PROPOFOL INFUSION 10 MG/ML OPTIME
INTRAVENOUS | Status: DC | PRN
  Administered 2014-09-25: 140 ug/kg/min via INTRAVENOUS

## 2014-09-25 MED ORDER — FENTANYL CITRATE (PF) 100 MCG/2ML IJ SOLN
INTRAMUSCULAR | Status: DC | PRN
  Administered 2014-09-25: 50 ug via INTRAVENOUS

## 2014-09-25 MED ORDER — LIDOCAINE HCL (CARDIAC) 20 MG/ML IV SOLN
INTRAVENOUS | Status: DC | PRN
  Administered 2014-09-25: 60 mg via INTRAVENOUS

## 2014-09-25 NOTE — H&P (Signed)
Primary Care Physician:  Enid Derry, MD  Pre-Procedure History & Physical: HPI:  Anna Lowe is a 69 y.o. female is here for an colonoscopy.   Past Medical History  Diagnosis Date  . Breast mass 08/27/2014    bilat 3:00 lumps    No past surgical history on file.  Prior to Admission medications   Medication Sig Start Date End Date Taking? Authorizing Provider  acetaminophen (TYLENOL) 500 MG tablet Take 1,000 mg by mouth every 6 (six) hours as needed for headache.   Yes Historical Provider, MD  albuterol (PROVENTIL HFA;VENTOLIN HFA) 108 (90 BASE) MCG/ACT inhaler Inhale 2 puffs into the lungs every 6 (six) hours as needed for wheezing or shortness of breath.   Yes Historical Provider, MD  cetirizine (ZYRTEC) 10 MG tablet Take 10 mg by mouth daily.   Yes Historical Provider, MD  Cholecalciferol (VITAMIN D) 2000 UNITS CAPS Take 1 capsule by mouth daily.   Yes Historical Provider, MD  clonazePAM (KLONOPIN) 0.5 MG tablet Take 0.5 mg by mouth every evening. 07/12/13  Yes Historical Provider, MD  ferrous sulfate 325 (65 FE) MG tablet Take 325 mg by mouth daily with breakfast.   Yes Historical Provider, MD  Fluticasone-Salmeterol (ADVAIR) 250-50 MCG/DOSE AEPB Inhale 1 puff into the lungs 2 (two) times daily. 07/12/13  Yes Historical Provider, MD  levothyroxine (SYNTHROID, LEVOTHROID) 75 MCG tablet Take 1 tablet by mouth daily. 07/12/13  Yes Historical Provider, MD  Multiple Vitamin (MULTIVITAMIN WITH MINERALS) TABS tablet Take 1 tablet by mouth daily.   Yes Historical Provider, MD  omeprazole (PRILOSEC) 20 MG capsule Take 20 mg by mouth daily. 07/12/13  Yes Historical Provider, MD  [place] Take 1 Container by mouth once. 09/11/14  Yes Historical Provider, MD  valsartan (DIOVAN) 40 MG tablet Take 40 mg by mouth daily as needed (high BP).   Yes Historical Provider, MD  tiotropium (SPIRIVA) 18 MCG inhalation capsule Place 1 capsule into inhaler and inhale daily. 05/21/14 05/21/15   Historical Provider, MD    Allergies as of 09/12/2014 - Review Complete 08/27/2014  Allergen Reaction Noted  . Aspirin  08/27/2014  . Band-aid liquid bandage [dermagran]  08/27/2014  . Codeine Itching 08/27/2014    No family history on file.  History   Social History  . Marital Status: Married    Spouse Name: N/A  . Number of Children: N/A  . Years of Education: N/A   Occupational History  . Not on file.   Social History Main Topics  . Smoking status: Not on file  . Smokeless tobacco: Not on file  . Alcohol Use: Not on file  . Drug Use: Not on file  . Sexual Activity: Not on file   Other Topics Concern  . Not on file   Social History Narrative  . No narrative on file     Physical Exam: BP 148/70 mmHg  Pulse 84  Temp(Src) 98.2 F (36.8 C) (Oral)  Resp 20  Ht 5\' 5"  (1.651 m)  Wt 130 lb (58.968 kg)  BMI 21.63 kg/m2  SpO2 98% General:   Alert,  pleasant and cooperative in NAD Head:  Normocephalic and atraumatic. Neck:  Supple; no masses or thyromegaly. Lungs:  Clear throughout to auscultation.    Heart:  Regular rate and rhythm. Abdomen:  Soft, nontender and nondistended. Normal bowel sounds, without guarding, and without rebound.   Neurologic:  Alert and  oriented x4;  grossly normal neurologically.  Impression/Plan: Anna Lowe is  here for an colonoscopy to be performed for colitis and t.i thickening on CT.    Risks, benefits, limitations, and alternatives regarding  colonoscopy have been reviewed with the patient.  Questions have been answered.  All parties agreeable.   Josefine Class, MD  09/25/2014, 10:47 AM

## 2014-09-25 NOTE — Discharge Instructions (Signed)

## 2014-09-25 NOTE — Anesthesia Preprocedure Evaluation (Signed)
Anesthesia Evaluation  Patient identified by MRN, date of birth, ID band Patient awake    Reviewed: Allergy & Precautions, NPO status , Patient's Chart, lab work & pertinent test results  History of Anesthesia Complications Negative for: history of anesthetic complications  Airway Mallampati: II  TM Distance: >3 FB Neck ROM: Full    Dental  (+) Upper Dentures, Lower Dentures   Pulmonary COPD COPD inhaler,  breath sounds clear to auscultation  Pulmonary exam normal       Cardiovascular Exercise Tolerance: Poor negative cardio ROS Normal cardiovascular examRhythm:Regular Rate:Normal     Neuro/Psych negative neurological ROS  negative psych ROS   GI/Hepatic Neg liver ROS, GERD-  Medicated,  Endo/Other  Hypothyroidism   Renal/GU negative Renal ROS  negative genitourinary   Musculoskeletal negative musculoskeletal ROS (+)   Abdominal   Peds negative pediatric ROS (+)  Hematology negative hematology ROS (+)   Anesthesia Other Findings   Reproductive/Obstetrics negative OB ROS                             Anesthesia Physical Anesthesia Plan  ASA: III  Anesthesia Plan: General   Post-op Pain Management:    Induction: Intravenous  Airway Management Planned: Nasal Cannula  Additional Equipment:   Intra-op Plan:   Post-operative Plan:   Informed Consent: I have reviewed the patients History and Physical, chart, labs and discussed the procedure including the risks, benefits and alternatives for the proposed anesthesia with the patient or authorized representative who has indicated his/her understanding and acceptance.     Plan Discussed with: CRNA and Surgeon  Anesthesia Plan Comments:         Anesthesia Quick Evaluation

## 2014-09-25 NOTE — Anesthesia Postprocedure Evaluation (Signed)
  Anesthesia Post-op Note  Patient: Anna Lowe  Procedure(s) Performed: Procedure(s): COLONOSCOPY WITH PROPOFOL (N/A)  Anesthesia type:General  Patient location: PACU  Post pain: Pain level controlled  Post assessment: Post-op Vital signs reviewed, Patient's Cardiovascular Status Stable, Respiratory Function Stable, Patent Airway and No signs of Nausea or vomiting  Post vital signs: Reviewed and stable  Last Vitals:  Filed Vitals:   09/25/14 1150  BP: 119/53  Pulse:   Temp:   Resp: 18    Level of consciousness: awake, alert  and patient cooperative  Complications: No apparent anesthesia complications

## 2014-09-25 NOTE — Transfer of Care (Signed)
Immediate Anesthesia Transfer of Care Note  Patient: Anna Lowe  Procedure(s) Performed: Procedure(s): COLONOSCOPY WITH PROPOFOL (N/A)  Patient Location: PACU and Endoscopy Unit  Anesthesia Type:General  Level of Consciousness: awake, alert  and oriented  Airway & Oxygen Therapy: Patient Spontanous Breathing and Patient connected to nasal cannula oxygen  Post-op Assessment: Report given to RN and Post -op Vital signs reviewed and stable  Post vital signs: Reviewed and stable  Last Vitals:  Filed Vitals:   09/25/14 0957  BP: 148/70  Pulse: 84  Temp: 36.8 C  Resp: 20    Complications: No apparent anesthesia complications

## 2014-09-25 NOTE — Op Note (Addendum)
[facility] Patient Name: Anna Lowe Procedure Date: 09/25/2014 10:42 AM MRN: [id number] Account #: [id number] Date of Birth: 12-02-1945 Admit Type: Outpatient Age: 69 Room: [facility] ENDO ROOM 3 Gender: Female Note Status: Finalized Procedure:         Colonoscopy Indications:       Abnormal CT of the GI tract m( pan-colonic thickening,                     ileal thickening) Patient Profile:   This is a 69 year old female. Providers:         Gerrit Heck. Rayann Heman, MD Referring MD:      Collie Siad (Referring MD) Medicines:         Propofol per Anesthesia Complications:     No immediate complications. Procedure:         Pre-Anesthesia Assessment:                    - Prior to the procedure, a History and Physical was                     performed, and patient medications and allergies were                     reviewed. The patient is competent. The risks and benefits                     of the procedure and the sedation options and risks were                     discussed with the patient. All questions were answered                     and informed consent was obtained. Patient identification                     and proposed procedure were verified by the physician and                     the nurse in the pre-procedure area. Mental Status                     Examination: alert and oriented. Airway Examination:                     normal oropharyngeal airway and neck mobility. Respiratory                     Examination: clear to auscultation. CV Examination: RRR,                     no murmurs, no S3 or S4. Prophylactic Antibiotics: The                     patient does not require prophylactic antibiotics. Prior                     Anticoagulants: The patient has taken no previous                     anticoagulant or antiplatelet agents. ASA Grade                     Assessment: II - A patient with mild systemic disease.  After  reviewing the risks and benefits, the patient was                     deemed in satisfactory condition to undergo the procedure.                     The anesthesia plan was to use monitored anesthesia care                     (MAC). Immediately prior to administration of medications,                     the patient was re-assessed for adequacy to receive                     sedatives. The heart rate, respiratory rate, oxygen                     saturations, blood pressure, adequacy of pulmonary                     ventilation, and response to care were monitored                     throughout the procedure. The physical status of the                     patient was re-assessed after the procedure.                    - Prior to the procedure, a History and Physical was                     performed, and patient medications, allergies and                     sensitivities were reviewed. The patient's tolerance of                     previous anesthesia was reviewed.                    After obtaining informed consent, the colonoscope was                     passed under direct vision. Throughout the procedure, the                     patient's blood pressure, pulse, and oxygen saturations                     were monitored continuously. The Olympus CF-Q160AL                     colonoscope (S#. [id number]) was introduced through the anus                     and advanced to the the terminal ileum. The colonoscopy                     was extremely difficult due to a tortuous colon.                     Successful completion of the procedure was aided by  withdrawing the scope and replacing with the adult                     endoscope. The patient tolerated the procedure well. The                     quality of the bowel preparation was excellent. Findings:      A 3 mm polyp was found in the sigmoid colon. The polyp was sessile. The       polyp was removed with a jumbo cold  forceps. Resection and retrieval       were complete.      The terminal ileum appeared normal.      The entire examined colon appeared normal on direct and retroflexion       views.      Biopsies were taken with a cold forceps in the terminal ileum for       histology.      Biopsies were taken with a cold forceps in the rectum, in the sigmoid       colon, in the descending colon, in the transverse colon, in the       ascending colon and in the cecum for histology.      - Very sharp turn in sigmoid. Could not pass wtih pediatric c-scope.       Could only pass with upper endoscope Impression:        - One 3 mm polyp in the sigmoid colon. Resected and                     retrieved.                    - The examined portion of the ileum was normal.                    - The entire examined colon is normal on direct and                     retroflexion views.                    - Biopsies were taken with a cold forceps for histology in                     the terminal ileum.                    - Biopsies were taken with a cold forceps for histology in                     the rectum, in the sigmoid colon, in the descending colon,                     in the transverse colon, in the ascending colon and in the                     cecum.                    - Very sharp turn in sigmoid. Could not pass wtih                     pediatric c-scope. Could only pass with upper endoscope Recommendation:    - Observe patient in GI recovery unit.                    -  Resume regular diet.                    - Continue present medications.                    - Await pathology results.                    - Repeat colonoscopy for surveillance based on pathology                     results.                    - Return to referring physician.                    - The findings and recommendations were discussed with the                     patient.                    - The findings and recommendations were  discussed with the                     patient's family. Procedure Code(s): --- Professional ---                    [phone], Colonoscopy, flexible; with biopsy, single or                     multiple CPT copyright 2014 [employer]. All rights reserved. The codes documented in this report are preliminary and upon coder review may  be revised to meet current compliance requirements. Mellody Life, MD 09/25/2014 11:34:43 AM This report has been signed electronically. Number of Addenda: 1 Note Initiated On: 09/25/2014 10:42 AM Scope Withdrawal Time: 0 hours 16 minutes 7 seconds  Total Procedure Duration: 0 hours 34 minutes 53 seconds       [facility]

## 2014-09-26 ENCOUNTER — Encounter: Payer: Self-pay | Admitting: Gastroenterology

## 2014-09-26 LAB — SURGICAL PATHOLOGY

## 2014-09-30 ENCOUNTER — Other Ambulatory Visit: Payer: Self-pay

## 2014-09-30 DIAGNOSIS — E039 Hypothyroidism, unspecified: Secondary | ICD-10-CM | POA: Insufficient documentation

## 2014-09-30 DIAGNOSIS — E038 Other specified hypothyroidism: Secondary | ICD-10-CM

## 2014-09-30 MED ORDER — LEVOTHYROXINE SODIUM 75 MCG PO TABS: 75.0000 ug | ORAL_TABLET | Freq: Every day | ORAL | Status: DC

## 2014-10-15 DIAGNOSIS — E876 Hypokalemia: Secondary | ICD-10-CM | POA: Insufficient documentation

## 2014-10-15 DIAGNOSIS — E8809 Other disorders of plasma-protein metabolism, not elsewhere classified: Secondary | ICD-10-CM | POA: Insufficient documentation

## 2014-10-15 DIAGNOSIS — R748 Abnormal levels of other serum enzymes: Secondary | ICD-10-CM | POA: Insufficient documentation

## 2014-10-15 DIAGNOSIS — E039 Hypothyroidism, unspecified: Secondary | ICD-10-CM | POA: Insufficient documentation

## 2014-10-15 DIAGNOSIS — D696 Thrombocytopenia, unspecified: Secondary | ICD-10-CM | POA: Insufficient documentation

## 2014-10-15 DIAGNOSIS — J449 Chronic obstructive pulmonary disease, unspecified: Secondary | ICD-10-CM | POA: Insufficient documentation

## 2014-10-15 DIAGNOSIS — E785 Hyperlipidemia, unspecified: Secondary | ICD-10-CM | POA: Insufficient documentation

## 2014-10-15 DIAGNOSIS — R740 Nonspecific elevation of levels of transaminase and lactic acid dehydrogenase [LDH]: Secondary | ICD-10-CM

## 2014-10-15 DIAGNOSIS — I10 Essential (primary) hypertension: Secondary | ICD-10-CM | POA: Insufficient documentation

## 2014-10-15 DIAGNOSIS — R7401 Elevation of levels of liver transaminase levels: Secondary | ICD-10-CM | POA: Insufficient documentation

## 2014-10-15 DIAGNOSIS — K219 Gastro-esophageal reflux disease without esophagitis: Secondary | ICD-10-CM | POA: Insufficient documentation

## 2014-10-15 DIAGNOSIS — M810 Age-related osteoporosis without current pathological fracture: Secondary | ICD-10-CM | POA: Insufficient documentation

## 2014-10-16 ENCOUNTER — Ambulatory Visit (INDEPENDENT_AMBULATORY_CARE_PROVIDER_SITE_OTHER): Payer: Medicare Other | Admitting: Family Medicine

## 2014-10-16 ENCOUNTER — Encounter: Payer: Self-pay | Admitting: Family Medicine

## 2014-10-16 ENCOUNTER — Telehealth: Payer: Self-pay | Admitting: Family Medicine

## 2014-10-16 VITALS — BP 149/82 | HR 83 | Temp 98.4°F | Ht 64.0 in | Wt 129.0 lb

## 2014-10-16 DIAGNOSIS — F411 Generalized anxiety disorder: Secondary | ICD-10-CM | POA: Insufficient documentation

## 2014-10-16 DIAGNOSIS — E039 Hypothyroidism, unspecified: Secondary | ICD-10-CM | POA: Diagnosis not present

## 2014-10-16 DIAGNOSIS — K219 Gastro-esophageal reflux disease without esophagitis: Secondary | ICD-10-CM

## 2014-10-16 MED ORDER — LEVOTHYROXINE SODIUM 75 MCG PO TABS: 75.0000 ug | ORAL_TABLET | Freq: Every day | ORAL | Status: DC

## 2014-10-16 MED ORDER — CLONAZEPAM 0.5 MG PO TABS: 0.5000 mg | ORAL_TABLET | Freq: Every day | ORAL | Status: DC

## 2014-10-16 MED ORDER — OMEPRAZOLE 20 MG PO CPDR: 20.0000 mg | DELAYED_RELEASE_CAPSULE | Freq: Every day | ORAL | Status: DC | PRN

## 2014-10-16 NOTE — Progress Notes (Signed)
BP 149/82 mmHg  Pulse 83  Temp(Src) 98.4 F (36.9 C) (Oral)  Ht 5\' 4"  (1.626 m)  Wt 129 lb (58.514 kg)  BMI 22.13 kg/m2  SpO2 97%   Subjective:    Patient ID: Anna Lowe, female    DOB: 1945-10-02, 69 y.o.   MRN: [id number]  HPI: Anna Lowe is a 69 y.o. female  Chief Complaint  Patient presents with  . Follow-up    Medication refill   Just needs prescriptions filled She takes clonazepam; she is down to just one a day, just needs one a day now; she is still taking one at night; quality of sleep with the medicine is good; she knows to not mix with any alcohol  She needs refill of omeprazole; onions are a trigger (raw); she sleeps on two pillows; no blood in the stool  She had so many tests done and they didn't find a single solitary thing She saw the gastroenterologist here and had the US done and liver was enlarged; he said her numbers are fine; 3/4 of Americans have enlarged livers he said; he told her all about the issues in [place] last year and they saw something; she did not have pain or diarrhea; she went in for the colonoscopy and had scan 2 weeks before her visit with him; he did not have the results during her visit but got it after she left No abdominal pain except for occasional left sided pain 20-25 minutes, but she says she doesn't care what it is at this point; she can eat anything she wants; he says she might have had Crohn's and it went away  BP was just 125/78 at home today  Relevant past medical, surgical, family and social history reviewed and updated as indicated. Interim medical history since our last visit reviewed. Allergies and medications reviewed and updated.  Review of Systems  Per HPI unless specifically indicated above     Objective:    BP 149/82 mmHg  Pulse 83  Temp(Src) 98.4 F (36.9 C) (Oral)  Ht 5\' 4"  (1.626 m)  Wt 129 lb (58.514 kg)  BMI 22.13 kg/m2  SpO2 97%  Wt Readings from Last 3 Encounters:  10/16/14 129 lb  (58.514 kg)  08/14/14 132 lb (59.875 kg)  09/25/14 130 lb (58.968 kg)    Physical Exam  Constitutional: She appears well-developed and well-nourished. No distress.  HENT:  Head: Normocephalic and atraumatic.  Eyes: EOM are normal. No scleral icterus.  Neck: No thyromegaly present.  Cardiovascular: Normal rate, regular rhythm and normal heart sounds.   No murmur heard. Pulmonary/Chest: Effort normal and breath sounds normal. No respiratory distress. She has no wheezes.  Abdominal: Soft. Bowel sounds are normal. She exhibits no distension. There is no tenderness.  Musculoskeletal: Normal range of motion. She exhibits no edema.  Neurological: She is alert. She exhibits normal muscle tone.  Skin: Skin is warm and dry. She is not diaphoretic. No pallor.  Psychiatric: She has a normal mood and affect. Her behavior is normal. Judgment and thought content normal.    Results for orders placed or performed in visit on 09/30/14  TSH  Result Value Ref Range   TSH 1.14 0.41 - 5.90 uIU/mL      Assessment & Plan:   Problem List Items Addressed This Visit      Digestive   GERD (gastroesophageal reflux disease)    Doing well on current PPI; refill med      Relevant Medications  omeprazole (PRILOSEC) 20 MG capsule     Endocrine   Hypothyroidism - Primary    Last TSH reviewed from February; normal; refills given, hand-written 75 mcg 90+2 refills; check TSH Feb or March of 2017      Relevant Medications   levothyroxine (SYNTHROID) 75 MCG tablet     Other   Generalized anxiety disorder       Follow up plan: Return in about 3 months (around 01/16/2015) for medication monitoring; return for Medicare visit when due.

## 2014-10-16 NOTE — Assessment & Plan Note (Signed)
Last TSH reviewed from February; normal; refills given, hand-written 75 mcg 90+2 refills; check TSH Feb or March of 2017

## 2014-10-16 NOTE — Telephone Encounter (Signed)
E-fax came through for refill: Rx: omeprazole (PRILOSEC) 20 MG capsule Copy of Rx in basket

## 2014-10-16 NOTE — Patient Instructions (Signed)
Return in 3 months for next medicine check Caution: prolonged use of proton pump inhibitors like omeprazole (Prilosec), pantoprazole (Protonix), esomeprazole (Nexium), and others like Dexilant and Aciphex may increase your risk of pneumonia, Clostridium difficile colitis, osteoporosis, anemia and other health complications Try to limit or avoid triggers like coffee, caffeinated beverages, onions, chocolate, spicy foods, peppermint, acid foods like pizza, spaghetti sauce, and orange juice Lose weight if you are overweight or obese Try elevating the head of your bed by placing a small wedge between your mattress and box springs to keep acid in the stomach at night instead of coming up into your esophagus Schedule a Medicare wellness visit when due

## 2014-10-16 NOTE — Assessment & Plan Note (Signed)
Doing well on current PPI; refill med

## 2014-10-17 NOTE — Telephone Encounter (Signed)
RX already filled. Duplicate message.

## 2014-12-01 DIAGNOSIS — R0602 Shortness of breath: Secondary | ICD-10-CM | POA: Diagnosis not present

## 2014-12-01 DIAGNOSIS — K219 Gastro-esophageal reflux disease without esophagitis: Secondary | ICD-10-CM | POA: Diagnosis not present

## 2014-12-01 DIAGNOSIS — J439 Emphysema, unspecified: Secondary | ICD-10-CM | POA: Diagnosis not present

## 2014-12-09 ENCOUNTER — Other Ambulatory Visit: Payer: Self-pay | Admitting: Family Medicine

## 2014-12-18 DIAGNOSIS — Z23 Encounter for immunization: Secondary | ICD-10-CM | POA: Diagnosis not present

## 2014-12-26 ENCOUNTER — Encounter: Payer: Self-pay | Admitting: Family Medicine

## 2014-12-26 ENCOUNTER — Ambulatory Visit (INDEPENDENT_AMBULATORY_CARE_PROVIDER_SITE_OTHER): Payer: Medicare Other | Admitting: Family Medicine

## 2014-12-26 ENCOUNTER — Other Ambulatory Visit: Payer: Self-pay | Admitting: Family Medicine

## 2014-12-26 VITALS — BP 172/90 | HR 72 | Temp 97.8°F | Ht 64.0 in | Wt 126.0 lb

## 2014-12-26 DIAGNOSIS — E038 Other specified hypothyroidism: Secondary | ICD-10-CM

## 2014-12-26 DIAGNOSIS — F411 Generalized anxiety disorder: Secondary | ICD-10-CM

## 2014-12-26 DIAGNOSIS — K219 Gastro-esophageal reflux disease without esophagitis: Secondary | ICD-10-CM

## 2014-12-26 DIAGNOSIS — D7589 Other specified diseases of blood and blood-forming organs: Secondary | ICD-10-CM | POA: Diagnosis not present

## 2014-12-26 DIAGNOSIS — M810 Age-related osteoporosis without current pathological fracture: Secondary | ICD-10-CM

## 2014-12-26 DIAGNOSIS — I10 Essential (primary) hypertension: Secondary | ICD-10-CM

## 2014-12-26 DIAGNOSIS — D696 Thrombocytopenia, unspecified: Secondary | ICD-10-CM

## 2014-12-26 DIAGNOSIS — G5792 Unspecified mononeuropathy of left lower limb: Secondary | ICD-10-CM | POA: Diagnosis not present

## 2014-12-26 DIAGNOSIS — R740 Nonspecific elevation of levels of transaminase and lactic acid dehydrogenase [LDH]: Secondary | ICD-10-CM

## 2014-12-26 DIAGNOSIS — G5791 Unspecified mononeuropathy of right lower limb: Secondary | ICD-10-CM | POA: Diagnosis not present

## 2014-12-26 DIAGNOSIS — E876 Hypokalemia: Secondary | ICD-10-CM

## 2014-12-26 DIAGNOSIS — D649 Anemia, unspecified: Secondary | ICD-10-CM | POA: Diagnosis not present

## 2014-12-26 DIAGNOSIS — R7401 Elevation of levels of liver transaminase levels: Secondary | ICD-10-CM

## 2014-12-26 MED ORDER — RANITIDINE HCL 300 MG PO TABS: 300.0000 mg | ORAL_TABLET | Freq: Every day | ORAL | Status: DC

## 2014-12-26 MED ORDER — CLONAZEPAM 0.5 MG PO TABS: 0.5000 mg | ORAL_TABLET | Freq: Every day | ORAL | Status: DC

## 2014-12-26 NOTE — Assessment & Plan Note (Signed)
Chronic, check today

## 2014-12-26 NOTE — Assessment & Plan Note (Signed)
Refills provided; no concern for misuse or diversion

## 2014-12-26 NOTE — Assessment & Plan Note (Signed)
recheck

## 2014-12-26 NOTE — Progress Notes (Signed)
BP 172/90 mmHg  Pulse 72  Temp(Src) 97.8 F (36.6 C)  Ht 5\' 4"  (1.626 m)  Wt 126 lb (57.153 kg)  BMI 21.62 kg/m2  SpO2 96%   Subjective:    Patient ID: Anna Lowe, female    DOB: [birth year], 69 y.o.   MRN: [id number]  HPI: Anna Lowe is a 69 y.o. female  Chief Complaint  Patient presents with  . Numbness    in feet when she sits  . Anxiety    needs a refill on her Klonoprin   Patient says her BP was up because she was dealing with stress right before coming in; someone had cigarettes smoke out there; she tried the lowdose diovan but made her very fatigue and diastolic dropped to 49; not taking anything  She has hypothyroidism; has been on current strength for two years; takes it at night before going to bed, takes it with klonopin; little jittery; numb but not really tingling; right worse than left; no back pain; no electric shocks down the legs; has a little fine tremor in the hands when holding something; lost 5 pounds, but that's intentional; she says her husband is diabetic and they are eating really healthy; husband has lost almost 20 pounds; no lumps in the throat, no sticking upon swallowing  She takes anxiety medicine; needs refill; taking klonopin; and that controls it pretty well; no alcohol; no pain medicines except tylenol  COPD, sees Dr. Raul Del; does have occasional headaches; no chest pains; no leg swelling  Relevant past medical, surgical, family and social history reviewed and updated as indicated. Interim medical history since our last visit reviewed. Allergies and medications reviewed and updated.  Review of Systems  Per HPI unless specifically indicated above     Objective:    BP 172/90 mmHg  Pulse 72  Temp(Src) 97.8 F (36.6 C)  Ht 5\' 4"  (1.626 m)  Wt 126 lb (57.153 kg)  BMI 21.62 kg/m2  SpO2 96%  Wt Readings from Last 3 Encounters:  12/26/14 126 lb (57.153 kg)  10/16/14 129 lb (58.514 kg)  08/14/14 132 lb (59.875 kg)     Physical Exam  Constitutional: She appears well-developed and well-nourished. No distress.  HENT:  Head: Normocephalic and atraumatic.  Eyes: EOM are normal. No scleral icterus.  Neck: No thyroid mass and no thyromegaly present.  Cardiovascular: Normal rate, regular rhythm and normal heart sounds.   No murmur heard. Pulmonary/Chest: Effort normal and breath sounds normal. No respiratory distress. She has no wheezes.  Abdominal: Soft. Bowel sounds are normal. She exhibits no distension.  Musculoskeletal: Normal range of motion. She exhibits no edema.       Right foot: There is decreased capillary refill.       Left foot: There is decreased capillary refill.  Neurological: She is alert. She displays tremor (fine tremor of both hands). She exhibits normal muscle tone.  Reflex Scores:      Patellar reflexes are 2+ on the right side and 2+ on the left side. Skin: Skin is warm and dry. She is not diaphoretic. No pallor.  Psychiatric: She has a normal mood and affect. Her behavior is normal. Judgment and thought content normal.    Results for orders placed or performed in visit on 09/30/14  TSH  Result Value Ref Range   TSH 1.14 0.41 - 5.90 uIU/mL      Assessment & Plan:   Problem List Items Addressed This Visit      Cardiovascular  and Mediastinum   Hypertension    Uncontrolled today, but may be hyperthyroid; check BP at home; 130/82 at home; no med changes today per patient      Relevant Orders   Lipid Panel w/o Chol/HDL Ratio     Digestive   GERD (gastroesophageal reflux disease)    Taking PPI; can reduce absorption of calcium and other divalent cations; might try H2 blocker      Relevant Medications   ranitidine (ZANTAC) 300 MG tablet     Endocrine   Hypothyroidism    Suspect dose needs adjusting; check TSH and free T4; may explain her numbness as well; if normal, then get ABI      Relevant Orders   TSH   T4, free     Musculoskeletal and Integument   Osteoporosis     Check TSH today and reduce dose if over-replaced; explained over-replacement can accelerate bone loss; get 3 servings of calcium a day; fall prevention stressed        Other   Thrombopenia    Chronic, check today      Relevant Orders   CBC with Differential/Platelet   Elevated serum glutamic pyruvic transaminase (SGPT) level    Recheck, followed by GI      Relevant Orders   Comprehensive metabolic panel   Hypokalemia    recheck      Generalized anxiety disorder - Primary    Refills provided; no concern for misuse or diversion         Follow up plan: Return in about 2 weeks (around 01/09/2015) for recheck of blood presure, bring your machine too.  Check labs, consider ABI if normal An after-visit summary was printed and given to the patient at [facility].  Please see the patient instructions which may contain other information and recommendations beyond what is mentioned above in the assessment and plan.  Orders Placed This Encounter  Procedures  . CBC with Differential/Platelet  . Lipid Panel w/o Chol/HDL Ratio  . TSH  . T4, free  . Comprehensive metabolic panel

## 2014-12-26 NOTE — Assessment & Plan Note (Signed)
Taking PPI; can reduce absorption of calcium and other divalent cations; might try H2 blocker

## 2014-12-26 NOTE — Assessment & Plan Note (Signed)
Check TSH today and reduce dose if over-replaced; explained over-replacement can accelerate bone loss; get 3 servings of calcium a day; fall prevention stressed

## 2014-12-26 NOTE — Telephone Encounter (Signed)
Routing to provider  

## 2014-12-26 NOTE — Telephone Encounter (Signed)
Pt would like to have zantac sent to [facility]

## 2014-12-26 NOTE — Assessment & Plan Note (Signed)
Recheck, followed by GI

## 2014-12-26 NOTE — Assessment & Plan Note (Signed)
Suspect dose needs adjusting; check TSH and free T4; may explain her numbness as well; if normal, then get ABI

## 2014-12-26 NOTE — Patient Instructions (Signed)
Do practice stress reduction when needed Try the DASH guidelines Monitor your blood pressure at home and call if over 150 on top Wean gradually off of the omeprazole if able Start the new stomach acid medicine, ranitidine We'll check labs today and contact you  Return in 2 weeks

## 2014-12-26 NOTE — Assessment & Plan Note (Signed)
Uncontrolled today, but may be hyperthyroid; check BP at home; 130/82 at home; no med changes today per patient

## 2014-12-27 LAB — T4, FREE: Free T4: 1.46 ng/dL (ref 0.82–1.77)

## 2014-12-27 LAB — COMPREHENSIVE METABOLIC PANEL
ALBUMIN: 4.6 g/dL (ref 3.6–4.8)
ALT: 54 IU/L — ABNORMAL HIGH (ref 0–32)
AST: 46 IU/L — ABNORMAL HIGH (ref 0–40)
Albumin/Globulin Ratio: 2.2 (ref 1.1–2.5)
Alkaline Phosphatase: 155 IU/L — ABNORMAL HIGH (ref 39–117)
BILIRUBIN TOTAL: 0.3 mg/dL (ref 0.0–1.2)
BUN / CREAT RATIO: 18 (ref 11–26)
BUN: 18 mg/dL (ref 8–27)
CO2: 27 mmol/L (ref 18–29)
Calcium: 9.7 mg/dL (ref 8.7–10.3)
Chloride: 98 mmol/L (ref 97–108)
Creatinine, Ser: 1.01 mg/dL — ABNORMAL HIGH (ref 0.57–1.00)
GFR calc non Af Amer: 58 mL/min/{1.73_m2} — ABNORMAL LOW (ref >59)
GFR, EST AFRICAN AMERICAN: 67 mL/min/{1.73_m2} (ref >59)
GLOBULIN, TOTAL: 2.1 g/dL (ref 1.5–4.5)
Glucose: 93 mg/dL (ref 65–99)
Potassium: 4.4 mmol/L (ref 3.5–5.2)
Sodium: 138 mmol/L (ref 134–144)
TOTAL PROTEIN: 6.7 g/dL (ref 6.0–8.5)

## 2014-12-27 LAB — LIPID PANEL W/O CHOL/HDL RATIO
CHOLESTEROL TOTAL: 201 mg/dL — AB (ref 100–199)
HDL: 57 mg/dL (ref >39)
LDL Calculated: 116 mg/dL — ABNORMAL HIGH (ref 0–99)
TRIGLYCERIDES: 142 mg/dL (ref 0–149)
VLDL CHOLESTEROL CAL: 28 mg/dL (ref 5–40)

## 2014-12-27 LAB — CBC WITH DIFFERENTIAL/PLATELET
BASOS ABS: 0.1 10*3/uL (ref 0.0–0.2)
Basos: 1 %
EOS (ABSOLUTE): 0.1 10*3/uL (ref 0.0–0.4)
EOS: 1 %
Hematocrit: 41.3 % (ref 34.0–46.6)
Hemoglobin: 13.7 g/dL (ref 11.1–15.9)
IMMATURE GRANS (ABS): 0 10*3/uL (ref 0.0–0.1)
IMMATURE GRANULOCYTES: 0 %
Lymphocytes Absolute: 1.4 10*3/uL (ref 0.7–3.1)
Lymphs: 20 %
MCH: 31.2 pg (ref 26.6–33.0)
MCHC: 33.2 g/dL (ref 31.5–35.7)
MCV: 94 fL (ref 79–97)
MONOCYTES: 10 %
Monocytes Absolute: 0.7 10*3/uL (ref 0.1–0.9)
NEUTROS PCT: 68 %
Neutrophils Absolute: 4.7 10*3/uL (ref 1.4–7.0)
PLATELETS: 73 10*3/uL — AB (ref 150–379)
RBC: 4.39 x10E6/uL (ref 3.77–5.28)
RDW: 14.7 % (ref 12.3–15.4)
WBC: 7 10*3/uL (ref 3.4–10.8)

## 2014-12-27 LAB — TSH: TSH: 1.3 u[IU]/mL (ref 0.450–4.500)

## 2014-12-30 ENCOUNTER — Telehealth: Payer: Self-pay | Admitting: Family Medicine

## 2014-12-30 DIAGNOSIS — G5793 Unspecified mononeuropathy of bilateral lower limbs: Secondary | ICD-10-CM | POA: Insufficient documentation

## 2014-12-30 DIAGNOSIS — D649 Anemia, unspecified: Secondary | ICD-10-CM | POA: Insufficient documentation

## 2014-12-30 DIAGNOSIS — D696 Thrombocytopenia, unspecified: Secondary | ICD-10-CM

## 2014-12-30 DIAGNOSIS — D7589 Other specified diseases of blood and blood-forming organs: Secondary | ICD-10-CM | POA: Insufficient documentation

## 2014-12-30 DIAGNOSIS — D538 Other specified nutritional anemias: Secondary | ICD-10-CM

## 2014-12-30 NOTE — Assessment & Plan Note (Signed)
Check B12 and folate 

## 2014-12-30 NOTE — Assessment & Plan Note (Signed)
Range 60s to 90s

## 2014-12-30 NOTE — Assessment & Plan Note (Addendum)
Normal thyroid; B12 normal; order NCS

## 2014-12-30 NOTE — Telephone Encounter (Signed)
I talked with patient about labs Platelets better than last year Alk phos and hepatic transaminases up; she has seen Dr. Rayann Heman, GI I'll ask him to look at these and decide when to see her back or when next f/u will be; he last saw her Sep 16, 2014, note reviewed Thyroid is normal, so that's not cause of numbness in feet I'll call her back tomorrow (she's in the grocery store right now) and we'll talk more about the feet symptoms I contacted lab to add-on B12 and folate ------------------------------------- Dr. Rayann Heman -- please review labs and contact patient as to your wishes (see her in office, additional testing, referral to [place], etc.) Thank you

## 2014-12-31 LAB — B12 AND FOLATE PANEL
Folate: >20 ng/mL (ref >3.0)
Vitamin B-12: 569 pg/mL (ref 211–946)

## 2014-12-31 LAB — SPECIMEN STATUS REPORT

## 2014-12-31 NOTE — Telephone Encounter (Signed)
I called, left detailed message Explained B12 and folate normal; will get nerve conduction studies of legs; refer to neuro if not obvious answer I briefly considered other testing, such as heavy metals, SPEP and UPEP, but rather than shotgun, will have neuro see her next She does not have macrocytosis and has normal albumin level

## 2015-01-09 ENCOUNTER — Ambulatory Visit (INDEPENDENT_AMBULATORY_CARE_PROVIDER_SITE_OTHER): Payer: Medicare Other | Admitting: Family Medicine

## 2015-01-09 ENCOUNTER — Other Ambulatory Visit: Payer: Self-pay | Admitting: Family Medicine

## 2015-01-09 ENCOUNTER — Encounter: Payer: Self-pay | Admitting: Family Medicine

## 2015-01-09 VITALS — BP 164/81 | HR 80 | Temp 98.3°F | Ht 64.1 in | Wt 124.8 lb

## 2015-01-09 DIAGNOSIS — G5791 Unspecified mononeuropathy of right lower limb: Secondary | ICD-10-CM

## 2015-01-09 DIAGNOSIS — G5792 Unspecified mononeuropathy of left lower limb: Secondary | ICD-10-CM

## 2015-01-09 DIAGNOSIS — E039 Hypothyroidism, unspecified: Secondary | ICD-10-CM

## 2015-01-09 DIAGNOSIS — G5793 Unspecified mononeuropathy of bilateral lower limbs: Secondary | ICD-10-CM

## 2015-01-09 DIAGNOSIS — I7300 Raynaud's syndrome without gangrene: Secondary | ICD-10-CM | POA: Diagnosis not present

## 2015-01-09 DIAGNOSIS — I10 Essential (primary) hypertension: Secondary | ICD-10-CM

## 2015-01-09 MED ORDER — VALSARTAN 40 MG PO TABS: 40.0000 mg | ORAL_TABLET | Freq: Every day | ORAL | Status: DC

## 2015-01-09 MED ORDER — AMLODIPINE BESYLATE 2.5 MG PO TABS
2.5000 mg | ORAL_TABLET | Freq: Every day | ORAL | Status: DC
Start: 2015-01-09 — End: 2015-04-06

## 2015-01-09 NOTE — Assessment & Plan Note (Signed)
Start CCB; order ABI to evaluate for PVD

## 2015-01-09 NOTE — Progress Notes (Signed)
BP 164/81 mmHg  Pulse 80  Temp(Src) 98.3 F (36.8 C)  Ht 5' 4.1" (1.628 m)  Wt 124 lb 12.8 oz (56.609 kg)  BMI 21.36 kg/m2  SpO2 97%   Subjective:    Patient ID: Anna Lowe, female    DOB: 1946/03/17, 69 y.o.   MRN: [id number]  HPI: Anna Lowe is a 69 y.o. female  Chief Complaint  Patient presents with  . Hypertension    pt brought her BP machine   She brought in her BP machine; she is willing to go back on the medicine; running 140s at home; lowest systolic in last few weeks in the 130s; highest was 148 at home; does not add salt to food; she knows how to check labels; hardly ever goes out to eat Used to take valsartan 40 mg, none for months  Doing well on clonazepam; refills UTD, none needed today  Allergic rhinitis; not taking any decongestents, just plain zyrtec  Raynauds; never tried CCB; does not want to see a vascular doctor right now  Relevant past medical, surgical, family and social history reviewed and updated as indicated. Interim medical history since our last visit reviewed. Allergies and medications reviewed and updated.  Review of Systems Per HPI unless specifically indicated above     Objective:    BP 164/81 mmHg  Pulse 80  Temp(Src) 98.3 F (36.8 C)  Ht 5' 4.1" (1.628 m)  Wt 124 lb 12.8 oz (56.609 kg)  BMI 21.36 kg/m2  SpO2 97%  Wt Readings from Last 3 Encounters:  01/09/15 124 lb 12.8 oz (56.609 kg)  12/26/14 126 lb (57.153 kg)  10/16/14 129 lb (58.514 kg)    Physical Exam  Constitutional: She appears well-developed and well-nourished. No distress.  HENT:  Head: Normocephalic and atraumatic.  Eyes: No scleral icterus.  Cardiovascular: Normal rate and regular rhythm.  Exam reveals decreased pulses.   Pulses:      Dorsalis pedis pulses are 1+ on the right side, and 1+ on the left side.       Posterior tibial pulses are 1+ on the right side, and 1+ on the left side.  Pulmonary/Chest: Effort normal and breath sounds normal.   Abdominal: She exhibits no distension.  Musculoskeletal: She exhibits no edema.    Results for orders placed or performed in visit on 12/26/14  CBC with Differential/Platelet  Result Value Ref Range   WBC 7.0 3.4 - 10.8 x10E3/uL   RBC 4.39 3.77 - 5.28 x10E6/uL   Hemoglobin 13.7 11.1 - 15.9 g/dL   Hematocrit 41.3 34.0 - 46.6 %   MCV 94 79 - 97 fL   MCH 31.2 26.6 - 33.0 pg   MCHC 33.2 31.5 - 35.7 g/dL   RDW 14.7 12.3 - 15.4 %   Platelets 73 (LL) 150 - 379 x10E3/uL   Neutrophils 68 %   Lymphs 20 %   Monocytes 10 %   Eos 1 %   Basos 1 %   Neutrophils Absolute 4.7 1.4 - 7.0 x10E3/uL   Lymphocytes Absolute 1.4 0.7 - 3.1 x10E3/uL   Monocytes Absolute 0.7 0.1 - 0.9 x10E3/uL   EOS (ABSOLUTE) 0.1 0.0 - 0.4 x10E3/uL   Basophils Absolute 0.1 0.0 - 0.2 x10E3/uL   Immature Granulocytes 0 %   Immature Grans (Abs) 0.0 0.0 - 0.1 x10E3/uL   Hematology Comments: Note:   Lipid Panel w/o Chol/HDL Ratio  Result Value Ref Range   Cholesterol, Total 201 (H) 100 - 199 mg/dL  Triglycerides 142 0 - 149 mg/dL   HDL 57 >39 mg/dL   VLDL Cholesterol Cal 28 5 - 40 mg/dL   LDL Calculated 116 (H) 0 - 99 mg/dL  TSH  Result Value Ref Range   TSH 1.300 0.450 - 4.500 uIU/mL  T4, free  Result Value Ref Range   Free T4 1.46 0.82 - 1.77 ng/dL  Comprehensive metabolic panel  Result Value Ref Range   Glucose 93 65 - 99 mg/dL   BUN 18 8 - 27 mg/dL   Creatinine, Ser 1.01 (H) 0.57 - 1.00 mg/dL   GFR calc non Af Amer 58 (L) >59 mL/min/1.73   GFR calc Af Amer 67 >59 mL/min/1.73   BUN/Creatinine Ratio 18 11 - 26   Sodium 138 134 - 144 mmol/L   Potassium 4.4 3.5 - 5.2 mmol/L   Chloride 98 97 - 108 mmol/L   CO2 27 18 - 29 mmol/L   Calcium 9.7 8.7 - 10.3 mg/dL   Total Protein 6.7 6.0 - 8.5 g/dL   Albumin 4.6 3.6 - 4.8 g/dL   Globulin, Total 2.1 1.5 - 4.5 g/dL   Albumin/Globulin Ratio 2.2 1.1 - 2.5   Bilirubin Total 0.3 0.0 - 1.2 mg/dL   Alkaline Phosphatase 155 (H) 39 - 117 IU/L   AST 46 (H) 0 - 40 IU/L    ALT 54 (H) 0 - 32 IU/L  B12 and Folate Panel  Result Value Ref Range   Vitamin B-12 569 211 - 946 pg/mL   Folate >20.0 >3.0 ng/mL  Specimen status report  Result Value Ref Range   specimen status report Comment       Assessment & Plan:   Problem List Items Addressed This Visit      Cardiovascular and Mediastinum   Hypertension - Primary    Start calcium channel blocker for hypertension which may also help if her lower extremity symptoms are coming from Raynaud's; close f/u; DASH guidelines; avoid decongestants      Relevant Medications   amLODipine (NORVASC) 2.5 MG tablet   Raynaud's disease    Start CCB; order ABI to evaluate for PVD      Relevant Medications   amLODipine (NORVASC) 2.5 MG tablet   Other Relevant Orders   ABI     Endocrine   Hypothyroidism    Last TSH normal        Nervous and Auditory   Neuropathy of both feet    Normal TSH and B12 earlier this month; symptoms may be all circulatory; will get ABI, lower extremity arterial doppler tests; start CCB         Follow up plan: Return in about 1 month (around 02/08/2015).  An after-visit summary was printed and given to the patient at [facility].  Please see the patient instructions which may contain other information and recommendations beyond what is mentioned above in the assessment and plan.

## 2015-01-09 NOTE — Assessment & Plan Note (Addendum)
Start calcium channel blocker for hypertension which may also help if her lower extremity symptoms are coming from Raynaud's; close f/u; DASH guidelines; avoid decongestants

## 2015-01-09 NOTE — Patient Instructions (Addendum)
Start amlodipine 2.5 mg daily; we can go up to 5 mg daily if needed Try to the follow the DASH guidelines Avoid decongestants Plain Zyrtec (cetirizine) is fine Monitor your blood pressure at home and let me know if not coming down Return in October when it fits your schedule We'll get the ankle brachial index test of your legs when you come back in October

## 2015-01-09 NOTE — Assessment & Plan Note (Signed)
abi

## 2015-01-11 NOTE — Assessment & Plan Note (Signed)
Normal TSH and B12 earlier this month; symptoms may be all circulatory; will get ABI, lower extremity arterial doppler tests; start CCB

## 2015-01-11 NOTE — Assessment & Plan Note (Signed)
Last TSH normal.  °

## 2015-02-03 DIAGNOSIS — Z85828 Personal history of other malignant neoplasm of skin: Secondary | ICD-10-CM | POA: Diagnosis not present

## 2015-02-03 DIAGNOSIS — L570 Actinic keratosis: Secondary | ICD-10-CM | POA: Diagnosis not present

## 2015-02-03 DIAGNOSIS — D485 Neoplasm of uncertain behavior of skin: Secondary | ICD-10-CM | POA: Diagnosis not present

## 2015-02-03 DIAGNOSIS — X32XXXA Exposure to sunlight, initial encounter: Secondary | ICD-10-CM | POA: Diagnosis not present

## 2015-02-03 DIAGNOSIS — B079 Viral wart, unspecified: Secondary | ICD-10-CM | POA: Diagnosis not present

## 2015-02-03 DIAGNOSIS — L814 Other melanin hyperpigmentation: Secondary | ICD-10-CM | POA: Diagnosis not present

## 2015-02-04 ENCOUNTER — Encounter: Payer: Self-pay | Admitting: Family Medicine

## 2015-02-04 ENCOUNTER — Ambulatory Visit (INDEPENDENT_AMBULATORY_CARE_PROVIDER_SITE_OTHER): Payer: Medicare Other | Admitting: Family Medicine

## 2015-02-04 ENCOUNTER — Telehealth: Payer: Self-pay | Admitting: Family Medicine

## 2015-02-04 VITALS — BP 151/80 | HR 91 | Temp 98.5°F | Wt 126.0 lb

## 2015-02-04 DIAGNOSIS — H6123 Impacted cerumen, bilateral: Secondary | ICD-10-CM

## 2015-02-04 DIAGNOSIS — I10 Essential (primary) hypertension: Secondary | ICD-10-CM | POA: Diagnosis not present

## 2015-02-04 DIAGNOSIS — J441 Chronic obstructive pulmonary disease with (acute) exacerbation: Secondary | ICD-10-CM

## 2015-02-04 DIAGNOSIS — I7300 Raynaud's syndrome without gangrene: Secondary | ICD-10-CM

## 2015-02-04 MED ORDER — PREDNISONE 10 MG (21) PO TBPK: ORAL_TABLET | ORAL | Status: DC

## 2015-02-04 MED ORDER — AZITHROMYCIN 250 MG PO TABS: ORAL_TABLET | ORAL | Status: DC

## 2015-02-04 NOTE — Telephone Encounter (Signed)
Pt isn't feeling well, wants advice on how to treat " chest cold"

## 2015-02-04 NOTE — Telephone Encounter (Signed)
Dr. Sanda Klein, please advise on what is safe for her to take.

## 2015-02-04 NOTE — Telephone Encounter (Signed)
I called patient back, she stated she had scheduled an appointment with [place] for this afternoon.

## 2015-02-04 NOTE — Telephone Encounter (Signed)
Rest and hydration and vitamin C and green tea are always my top recommendations Plain mucinex helps loosen phlegm Chest rub externally helps as well (like vicks vap-o-rub) Plain tylenol per package directions would help aches or fever

## 2015-02-04 NOTE — Progress Notes (Signed)
BP 151/80 mmHg  Pulse 91  Temp(Src) 98.5 F (36.9 C)  Wt 126 lb (57.153 kg)  SpO2 98%   Subjective:    Patient ID: Anna Lowe, female    DOB: 1945-11-19, 69 y.o.   MRN: [id number]  HPI: Anna Lowe is a 69 y.o. female  Chief Complaint  Patient presents with  . URI    cough, congestion, no fever that she knows of.  Started yesterday.   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 Interim medical history since our last visit reviewed. Allergies and medications reviewed and updated.  Review of Systems  Per HPI unless specifically indicated above     Objective:    BP 151/80 mmHg  Pulse 91  Temp(Src) 98.5 F (36.9 C)  Wt 126 lb (57.153 kg)  SpO2 98%  Wt Readings from Last 3 Encounters:  02/04/15 126 lb (57.153 kg)  01/09/15 124 lb 12.8 oz (56.609 kg)  12/26/14 126 lb (57.153 kg)    Physical Exam  Constitutional: She appears well-developed and well-nourished. No distress.  HENT:  Right Ear: External ear normal.  Left Ear: External ear normal.  Nose: No mucosal edema or rhinorrhea.  Mouth/Throat: Oropharynx is clear and moist and mucous membranes are normal.  Voice is hoarse; both external auditory canals are packed with hard brown cerumen; attempt at removal did not result in  significant wax removal  Eyes: EOM are normal. Right eye exhibits no discharge and no exudate. Left eye exhibits no discharge and no exudate. Right conjunctiva is not injected. Left conjunctiva is not injected. No scleral icterus.  Cardiovascular: Normal rate and regular rhythm.   Pulmonary/Chest: Effort normal and breath sounds normal.  Skin: Lesion noted.  Blister on the dorsum of hand (I believe her right, but not sure); surface consistent with keratotic lesion status post cryo or other destructive treatment, blister filled with violaceous fluid ("blood blister")  Psychiatric: She has a normal mood and affect. Her behavior is normal.   Results for orders placed or performed in visit on 12/26/14  CBC with Differential/Platelet  Result Value Ref Range   WBC 7.0 3.4 - 10.8 x10E3/uL   RBC 4.39 3.77 - 5.28 x10E6/uL   Hemoglobin 13.7 11.1 - 15.9 g/dL   Hematocrit 41.3 34.0 - 46.6 %   MCV 94 79 - 97 fL   MCH 31.2 26.6 - 33.0 pg   MCHC 33.2 31.5 - 35.7 g/dL   RDW 14.7 12.3 - 15.4 %   Platelets 73 (LL) 150 - 379 x10E3/uL   Neutrophils 68 %   Lymphs 20 %   Monocytes 10 %   Eos 1 %   Basos 1 %   Neutrophils Absolute 4.7 1.4 - 7.0 x10E3/uL   Lymphocytes Absolute 1.4  0.7 - 3.1 x10E3/uL   Monocytes Absolute 0.7 0.1 - 0.9 x10E3/uL   EOS (ABSOLUTE) 0.1 0.0 - 0.4 x10E3/uL   Basophils Absolute 0.1 0.0 - 0.2 x10E3/uL   Immature Granulocytes 0 %   Immature Grans (Abs) 0.0 0.0 - 0.1 x10E3/uL   Hematology Comments: Note:   Lipid Panel w/o Chol/HDL Ratio  Result Value Ref Range   Cholesterol, Total 201 (H) 100 - 199 mg/dL   Triglycerides 142 0 - 149 mg/dL   HDL 57 >39 mg/dL   VLDL Cholesterol Cal 28 5 - 40 mg/dL   LDL Calculated 116 (H) 0 - 99 mg/dL  TSH  Result Value Ref Range   TSH 1.300 0.450 - 4.500 uIU/mL  T4, free  Result Value Ref Range   Free T4 1.46 0.82 - 1.77 ng/dL  Comprehensive metabolic panel  Result Value Ref Range   Glucose 93 65 - 99 mg/dL   BUN 18 8 - 27 mg/dL    Creatinine, Ser 1.01 (H) 0.57 - 1.00 mg/dL   GFR calc non Af Amer 58 (L) >59 mL/min/1.73   GFR calc Af Amer 67 >59 mL/min/1.73   BUN/Creatinine Ratio 18 11 - 26   Sodium 138 134 - 144 mmol/L   Potassium 4.4 3.5 - 5.2 mmol/L   Chloride 98 97 - 108 mmol/L   CO2 27 18 - 29 mmol/L   Calcium 9.7 8.7 - 10.3 mg/dL   Total Protein 6.7 6.0 - 8.5 g/dL   Albumin 4.6 3.6 - 4.8 g/dL   Globulin, Total 2.1 1.5 - 4.5 g/dL   Albumin/Globulin Ratio 2.2 1.1 - 2.5   Bilirubin Total 0.3 0.0 - 1.2 mg/dL   Alkaline Phosphatase 155 (H) 39 - 117 IU/L   AST 46 (H) 0 - 40 IU/L   ALT 54 (H) 0 - 32 IU/L  B12 and Folate Panel  Result Value Ref Range   Vitamin B-12 569 211 - 946 pg/mL   Folate >20.0 >3.0 ng/mL  Specimen status report  Result Value Ref Range   specimen status report Comment       Assessment & Plan:   Problem List Items Addressed This Visit      Cardiovascular and Mediastinum   Hypertension    Fair control; target systolic less than 476 mmHg; if not improved at f/u, then will increase the CCB      Raynaud's disease    Improved with addition of the calcium channel blocker per patient; glad to hear that; continue        Nervous and Auditory   Cerumen impaction    Attempted to remove cerumen, but hard and packed; advised patient to use cerumenex or debrox ear drops in both canals for the next 1-2 weeks and return for cerumen removal; she agrees       Other Visit Diagnoses    COPD with acute exacerbation ([place])    -  Primary    treat with antibiotics, steroids, SABA; patient to call or seek treatment if getting worse    Relevant Medications    azithromycin (ZITHROMAX) 250 MG tablet    predniSONE (STERAPRED UNI-PAK 21 TAB) 10 MG (21) TBPK tablet       Blister on hand status post cryotherapy; does not appear infected; patient to observe, contact dermatologist with any problems  Follow up plan: Return 1-2 weeks, for ear wax removal with Dr. Sanda Klein.  Meds ordered this encounter   Medications  . azithromycin (ZITHROMAX) 250 MG tablet  Sig: Two pills by mouth on day 1, and then one pill daily on days 2-5    Dispense:  6 tablet    Refill:  0  . predniSONE (STERAPRED UNI-PAK 21 TAB) 10 MG (21) TBPK tablet    Sig: Six pills by mouth on day 1, then 5 on day 2, then 4 on day 3, then 3 on day 4, 2 on day 5, and 1 on day 6; take with food    Dispense:  21 tablet    Refill:  0  An after-visit summary was printed and given to the patient at [place].  Please see the patient instructions which may contain other information and recommendations beyond what is mentioned above in the assessment and plan.

## 2015-02-04 NOTE — Patient Instructions (Signed)
Try vitamin C (orange juice if not diabetic or vitamin C tablets) and drink green tea to help your immune system during your illness Get plenty of rest and hydration Start the new antibiotic Start the prednisone  Use the inhaler as directed if needed Pick up some cerumenex or debrox for ear wax and use as directed; then return in 1-2 weeks for ear wax removal

## 2015-02-06 DIAGNOSIS — H6120 Impacted cerumen, unspecified ear: Secondary | ICD-10-CM | POA: Insufficient documentation

## 2015-02-06 NOTE — Assessment & Plan Note (Signed)
Improved with addition of the calcium channel blocker per patient; glad to hear that; continue

## 2015-02-06 NOTE — Assessment & Plan Note (Signed)
Fair control; target systolic less than 270 mmHg; if not improved at f/u, then will increase the CCB

## 2015-02-06 NOTE — Assessment & Plan Note (Signed)
Attempted to remove cerumen, but hard and packed; advised patient to use cerumenex or debrox ear drops in both canals for the next 1-2 weeks and return for cerumen removal; she agrees

## 2015-03-05 ENCOUNTER — Other Ambulatory Visit: Payer: Self-pay | Admitting: Family Medicine

## 2015-03-05 DIAGNOSIS — R0989 Other specified symptoms and signs involving the circulatory and respiratory systems: Secondary | ICD-10-CM

## 2015-03-05 DIAGNOSIS — I7300 Raynaud's syndrome without gangrene: Secondary | ICD-10-CM

## 2015-03-10 ENCOUNTER — Ambulatory Visit: Payer: Medicare Other

## 2015-03-10 ENCOUNTER — Ambulatory Visit (INDEPENDENT_AMBULATORY_CARE_PROVIDER_SITE_OTHER): Payer: Medicare Other

## 2015-03-10 DIAGNOSIS — I7300 Raynaud's syndrome without gangrene: Secondary | ICD-10-CM | POA: Diagnosis not present

## 2015-03-10 DIAGNOSIS — R0989 Other specified symptoms and signs involving the circulatory and respiratory systems: Secondary | ICD-10-CM | POA: Diagnosis not present

## 2015-03-30 ENCOUNTER — Other Ambulatory Visit: Payer: Self-pay | Admitting: Family Medicine

## 2015-03-30 NOTE — Telephone Encounter (Signed)
Routing to provider  

## 2015-03-31 NOTE — Telephone Encounter (Signed)
Last visit October; next visit next week; Rx approved

## 2015-04-06 ENCOUNTER — Ambulatory Visit (INDEPENDENT_AMBULATORY_CARE_PROVIDER_SITE_OTHER): Payer: Medicare Other | Admitting: Family Medicine

## 2015-04-06 ENCOUNTER — Encounter: Payer: Self-pay | Admitting: Family Medicine

## 2015-04-06 VITALS — BP 135/72 | HR 79 | Temp 97.9°F | Wt 126.0 lb

## 2015-04-06 DIAGNOSIS — I10 Essential (primary) hypertension: Secondary | ICD-10-CM | POA: Diagnosis not present

## 2015-04-06 DIAGNOSIS — M810 Age-related osteoporosis without current pathological fracture: Secondary | ICD-10-CM | POA: Diagnosis not present

## 2015-04-06 DIAGNOSIS — R740 Nonspecific elevation of levels of transaminase and lactic acid dehydrogenase [LDH]: Secondary | ICD-10-CM

## 2015-04-06 DIAGNOSIS — H6123 Impacted cerumen, bilateral: Secondary | ICD-10-CM | POA: Diagnosis not present

## 2015-04-06 DIAGNOSIS — J449 Chronic obstructive pulmonary disease, unspecified: Secondary | ICD-10-CM

## 2015-04-06 DIAGNOSIS — R748 Abnormal levels of other serum enzymes: Secondary | ICD-10-CM

## 2015-04-06 DIAGNOSIS — Z1159 Encounter for screening for other viral diseases: Secondary | ICD-10-CM | POA: Insufficient documentation

## 2015-04-06 DIAGNOSIS — K219 Gastro-esophageal reflux disease without esophagitis: Secondary | ICD-10-CM

## 2015-04-06 DIAGNOSIS — I7300 Raynaud's syndrome without gangrene: Secondary | ICD-10-CM

## 2015-04-06 DIAGNOSIS — R7401 Elevation of levels of liver transaminase levels: Secondary | ICD-10-CM

## 2015-04-06 MED ORDER — AMLODIPINE BESYLATE 2.5 MG PO TABS: 2.5000 mg | ORAL_TABLET | Freq: Every day | ORAL | Status: DC

## 2015-04-06 MED ORDER — OMEPRAZOLE 20 MG PO CPDR: 20.0000 mg | DELAYED_RELEASE_CAPSULE | Freq: Every day | ORAL | Status: DC | PRN

## 2015-04-06 NOTE — Patient Instructions (Signed)
Get three servings of calcium a day Keep using the ear drops daily in the right ear canal Calcium should be 1200 mg total per day, 600 mg max per servings

## 2015-04-06 NOTE — Progress Notes (Signed)
BP 135/72 mmHg  Pulse 79  Temp(Src) 97.9 F (36.6 C)  Wt 126 lb (57.153 kg)  SpO2 96%   Subjective:    Patient ID: Anna Lowe, female    DOB: 18-Dec-1945, 69 y.o.   MRN: [id number]  HPI: Anna Lowe is a 69 y.o. female  Chief Complaint  Patient presents with  . Hyperlipidemia  . Hypertension  . Cerumen Impaction    follow up, was unable to remove it all at last visit  . Labs Only    would like Hep C screening   She has had the postnasal drip; bothering her ears; just the constant drip, clearing the throat, wad in the throat; no fevers  Using ear drops, both ears; here to get the wax cleaned out today  High blood pressure; controlled today  She has high cholesterol; no medicines; tries to control with diet; total was ONLY 201, and HDL was 57  Her feet are 85 to 95% better on the new medicine; just notices a little bit if feet get cold  Last labs showed normal 123456 and folic acid Sept 2, [id number]: Vitamin B-12 211 - 946 pg/mL 569   Folate >3.0 ng/mL >20.0         On Advair for COPD; works well; uses nebulizer a few times a day  GERD; needs refills; failed ranitidine; takes multiple vitamins  She is doing well on clonazepam, just at bedtime  She needs her prescriptions, just leave on-hold  Relevant past medical, surgical, family and social history reviewed and updated as indicated  Interim medical history since our last visit reviewed. She had the scan of her pulses, and he didn't find anything; she has no claudication  Allergies and medications reviewed and updated.  Review of Systems Per HPI unless specifically indicated above     Objective:    BP 135/72 mmHg  Pulse 79  Temp(Src) 97.9 F (36.6 C)  Wt 126 lb (57.153 kg)  SpO2 96%  Wt Readings from Last 3 Encounters:  04/06/15 126 lb (57.153 kg)  02/04/15 126 lb (57.153 kg)  01/09/15 124 lb 12.8 oz (56.609 kg)   Physical Exam  Constitutional: She appears well-developed and well-nourished.  No distress.  HENT:  Mouth/Throat: Mucous membranes are normal.  Both ears with impacted cerumen at beginning of visit; warm water lavage/irrigation and curettage under direct visualization by MD yielded complete removal of all cerumen from the left canal, but only about 1/3 of the cerumen from the right canal patient tolerated procedure well without dizziness  Eyes: Left pupil is not round.  Deformed left pupil after surgery; unchanged  Cardiovascular: Normal rate and regular rhythm.   No extrasystoles are present.  Pulmonary/Chest: Effort normal and breath sounds normal.  Musculoskeletal:       Thoracic back: She exhibits deformity (slight kyphosis).  Neurological: She is alert. She displays no tremor.  Skin: No pallor.  Psychiatric: She has a normal mood and affect. Her speech is normal and behavior is normal. Judgment normal. Cognition and memory are normal.  Very pleasant and cooperative   Results for orders placed or performed in visit on 04/06/15  Hepatitis C antibody  Result Value Ref Range   Hep C Virus Ab 0.1 0.0 - 0.9 s/co ratio      Assessment & Plan:   Problem List Items Addressed This Visit      Cardiovascular and Mediastinum   Hypertension    Doing well with CCB (switched to helped Raynaud's); continue  current meds      Relevant Medications   amLODipine (NORVASC) 2.5 MG tablet   Raynaud's disease    Vascular studies of lower extremities were unremarkable; she is doing very well with the new CCB; continue same      Relevant Medications   amLODipine (NORVASC) 2.5 MG tablet     Respiratory   COPD (chronic obstructive pulmonary disease) ([place])    Doing well with her Advair; continue same        Digestive   GERD (gastroesophageal reflux disease)    Controlled with H2 blocker; avoid triggers; I would prefer to avoid using a PPI because of the effect it may have on her bone health, though      Relevant Medications   omeprazole (PRILOSEC) 20 MG capsule      Nervous and Auditory   Cerumen impaction    The left ear was completed cleared of cerumen with combination of both irrigation and removal with curette under direct visualization by MD; the ear canal was packed and only about 1/3 of the wax was removed, again with combination of irrigation and curettage; patient will continue drops and return in a few weeks for another attempt; we can refer her to ENT for definitive cleaning if she desires or if next attempt here unsuccessful        Musculoskeletal and Integument   Osteoporosis - Primary    Order DEXA scan      Relevant Orders   DG Bone Density     Other   Elevated alkaline phosphatase level    She has seen Dr. Rayann Heman (GI)      Elevated serum glutamic pyruvic transaminase (SGPT) level    She has seen Dr. Rayann Heman (GI)      Need for hepatitis C screening test    Patient wishes to have the one time hep C antibody test; ordered      Relevant Orders   Hepatitis C antibody (Completed)      Follow up plan: Return 1-2 weeks, for ear irrigation.  Orders Placed This Encounter  Procedures  . DG Bone Density  . Hepatitis C antibody   Meds ordered this encounter  Medications  . amLODipine (NORVASC) 2.5 MG tablet    Sig: Take 1 tablet (2.5 mg total) by mouth daily.    Dispense:  30 tablet    Refill:  11    CANCEL the Rx for valsartan and use this instead  . omeprazole (PRILOSEC) 20 MG capsule    Sig: Take 1 capsule (20 mg total) by mouth daily as needed.    Dispense:  30 capsule    Refill:  5   An after-visit summary was printed and given to the patient at [facility].  Please see the patient instructions which may contain other information and recommendations beyond what is mentioned above in the assessment and plan.

## 2015-04-07 LAB — HEPATITIS C ANTIBODY: Hep C Virus Ab: 0.1 s/co ratio (ref 0.0–0.9)

## 2015-04-11 ENCOUNTER — Telehealth: Payer: Self-pay | Admitting: Family Medicine

## 2015-04-11 NOTE — Assessment & Plan Note (Signed)
The left ear was completed cleared of cerumen with combination of both irrigation and removal with curette under direct visualization by MD; the ear canal was packed and only about 1/3 of the wax was removed, again with combination of irrigation and curettage; patient will continue drops and return in a few weeks for another attempt; we can refer her to ENT for definitive cleaning if she desires or if next attempt here unsuccessful

## 2015-04-11 NOTE — Assessment & Plan Note (Signed)
Vascular studies of lower extremities were unremarkable; she is doing very well with the new CCB; continue same

## 2015-04-11 NOTE — Assessment & Plan Note (Signed)
Patient wishes to have the one time hep C antibody test; ordered

## 2015-04-11 NOTE — Assessment & Plan Note (Signed)
Doing well with CCB (switched to helped Raynaud's); continue current meds

## 2015-04-11 NOTE — Assessment & Plan Note (Addendum)
Controlled with H2 blocker; avoid triggers; I would prefer to avoid using a PPI because of the effect it may have on her bone health, though

## 2015-04-11 NOTE — Assessment & Plan Note (Signed)
Doing well with her Advair; continue same

## 2015-04-11 NOTE — Assessment & Plan Note (Signed)
Order DEXA scan 

## 2015-04-11 NOTE — Assessment & Plan Note (Signed)
She has seen Dr. Rayann Heman (GI)

## 2015-04-11 NOTE — Telephone Encounter (Signed)
Upon looking back through patient's labs and chart, I don't see any response from Dr. Rayann Heman (GI) about patient's abnormal lab results in Sept I will contact patient at a reasonable time (it is Saturday night at 11:30 pm) and see what he said or what follow-up was done with those

## 2015-04-12 NOTE — Telephone Encounter (Signed)
I tried calling her, left brief message; will try again on Monday

## 2015-04-14 NOTE — Telephone Encounter (Signed)
I talked with the patient; she said she saw Dr. Rayann Heman and he said there was nothing to worry about with the liver; I explained that I was following up with the labs done in September; she does not think she saw him after that, but with the all the testing she had earlier, he told her her liver was fine I told her I really want him to follow-up on the lab results; I'll call his office and then we'll recontact her if she needs to see him or get more labs ------------------------- I then called Dr. Jackalyn Lombard office; he is on vacation; I spoke with nurse practitioner; she is aware of the messaging system Fax labs to [facility] [phone] She says they will call the patient directly about what to do (more labs, see her in office, etc.) ------------------------- Kristin Bruins -- could you please fax the last set of labs to GI? Thank you

## 2015-04-16 NOTE — Telephone Encounter (Signed)
Labs were faxed 04/14/2015.

## 2015-04-16 NOTE — Telephone Encounter (Signed)
KERI -- could you please document if this was done and sign off? Thank you

## 2015-05-15 ENCOUNTER — Telehealth: Payer: Self-pay | Admitting: Family Medicine

## 2015-05-15 NOTE — Telephone Encounter (Signed)
Pt would like a call back about her pharmacy.

## 2015-05-18 NOTE — Telephone Encounter (Signed)
I spoke with patient, she states that [facility]/Insurance company are requiring new prior auth for her name brand only Synthroid rx. She states that when she takes the generic it does not work for her and her thyroid stays out of control. She states the number to call is [phone]

## 2015-05-19 NOTE — Telephone Encounter (Signed)
I spoke with [employer] and completed the brand name only request. It was approved. Case ID # [id number] Approved from : 04/28/15-05/18/16

## 2015-06-12 ENCOUNTER — Ambulatory Visit (INDEPENDENT_AMBULATORY_CARE_PROVIDER_SITE_OTHER): Payer: Medicare Other | Admitting: Family Medicine

## 2015-06-12 ENCOUNTER — Encounter: Payer: Self-pay | Admitting: Family Medicine

## 2015-06-12 VITALS — BP 158/88 | HR 92 | Temp 97.9°F | Ht 63.68 in | Wt 124.6 lb

## 2015-06-12 DIAGNOSIS — R109 Unspecified abdominal pain: Secondary | ICD-10-CM | POA: Diagnosis not present

## 2015-06-12 DIAGNOSIS — N362 Urethral caruncle: Secondary | ICD-10-CM

## 2015-06-12 DIAGNOSIS — R102 Pelvic and perineal pain unspecified side: Secondary | ICD-10-CM

## 2015-06-12 DIAGNOSIS — R740 Nonspecific elevation of levels of transaminase and lactic acid dehydrogenase [LDH]: Secondary | ICD-10-CM

## 2015-06-12 DIAGNOSIS — R7401 Elevation of levels of liver transaminase levels: Secondary | ICD-10-CM

## 2015-06-12 DIAGNOSIS — D696 Thrombocytopenia, unspecified: Secondary | ICD-10-CM

## 2015-06-12 DIAGNOSIS — E039 Hypothyroidism, unspecified: Secondary | ICD-10-CM

## 2015-06-12 LAB — UA/M W/RFLX CULTURE, ROUTINE
Bilirubin, UA: NEGATIVE
GLUCOSE, UA: NEGATIVE
KETONES UA: NEGATIVE
Leukocytes, UA: NEGATIVE
NITRITE UA: NEGATIVE
PROTEIN UA: NEGATIVE
SPEC GRAV UA: 1.015 (ref 1.005–1.030)
Urobilinogen, Ur: 0.2 mg/dL (ref 0.2–1.0)
pH, UA: 6 (ref 5.0–7.5)

## 2015-06-12 LAB — MICROSCOPIC EXAMINATION: WBC, UA: NONE SEEN /hpf (ref 0–?)

## 2015-06-12 NOTE — Progress Notes (Signed)
BP 158/88 mmHg  Pulse 92  Temp(Src) 97.9 F (36.6 C)  Ht 5' 3.68" (1.617 m)  Wt 124 lb 9.6 oz (56.518 kg)  BMI 21.62 kg/m2  SpO2 96%   Subjective:    Patient ID: Anna Lowe, female    DOB: 1945-06-05, 70 y.o.   MRN: [id number]  HPI: Anna Lowe is a 70 y.o. female  Chief Complaint  Patient presents with  . Left Side Pain    Lower pelvic. Burns sometimes. Skin almost feels hot sometimes patient states. Pain comes and goes.    Patient is here for an acute visit It's not even a pain; it's like pressure; it comes on occasionally and feels like pressure across the whole abdomen, maybe daily for the last week; Sunday was the worst; weird day; felt hot all the way down both legs; she checked her blood pressure; walked around and was okay No blood in the stool, no blood in the urine No hx of kidney stones A kidney stone would hurt, but this doesn't hurt she says Has ovaries No constipation at all Emptying bladder thoroughly except after coffee in the morning; no burning with urination No fevers She had something this last year and had a scan, same location, had scan; was nothing; this started back up a week or so; had been fine in between now and then No excessive gas from above, but feels like a gas bubble in the lower abdomen No hx of ovarian cysts; no hx of ovarian cancer in the family Pain does not change with movement No rash around the waist or flank, nothing like shingles She just had colonoscopy with [facility]  She says the thyroid medicine really went up in price, $55 to $95 a prescription; she is interested in doing the generic if I agree  Relevant past medical, surgical, family and social history reviewed and updated as indicated. Interim medical history since our last visit reviewed. Allergies and medications reviewed and updated.  Review of Systems Per HPI unless specifically indicated above     Objective:    BP 158/88 mmHg  Pulse 92  Temp(Src)  97.9 F (36.6 C)  Ht 5' 3.68" (1.617 m)  Wt 124 lb 9.6 oz (56.518 kg)  BMI 21.62 kg/m2  SpO2 96%  Wt Readings from Last 3 Encounters:  06/12/15 124 lb 9.6 oz (56.518 kg)  04/06/15 126 lb (57.153 kg)  02/04/15 126 lb (57.153 kg)    Physical Exam  Constitutional: She appears well-developed and well-nourished.  Weight down 2 pounds over last 2-4 months  Eyes: Right eye exhibits no discharge. Left eye exhibits no discharge. Left pupil is not round (chronic following surgery).  Cardiovascular: Normal rate and regular rhythm.   Pulmonary/Chest: Effort normal and breath sounds normal.  Abdominal: Soft. Bowel sounds are normal. She exhibits no distension and no mass. There is no tenderness. There is no guarding.  Genitourinary:    There is no rash or lesion on the right labia. There is no rash or lesion on the left labia. Uterus is not enlarged and not tender. Cervix exhibits no motion tenderness, no discharge and no friability. Right adnexum displays no mass, no tenderness and no fullness. Left adnexum displays no mass, no tenderness and no fullness. No bleeding in the vagina. No vaginal discharge found.  Lymphadenopathy:    She has no cervical adenopathy.       Right: No inguinal adenopathy present.       Left: No inguinal adenopathy  present.  Skin: No rash noted.  Specifically, no rash over the left flank, abdomen, inguinal area  Psychiatric: She has a normal mood and affect.   Results for orders placed or performed in visit on 06/12/15  Microscopic Examination  Result Value Ref Range   WBC, UA None seen 0 -  5 /hpf   RBC, UA 0-2 0 -  2 /hpf   Epithelial Cells (non renal) 0-10 0 - 10 /hpf   Bacteria, UA Few None seen/Few  UA/M w/rflx Culture, Routine  Result Value Ref Range   Specific Gravity, UA 1.015 1.005 - 1.030   pH, UA 6.0 5.0 - 7.5   Color, UA Yellow Yellow   Appearance Ur Clear Clear   Leukocytes, UA Negative Negative   Protein, UA Negative Negative/Trace   Glucose, UA  Negative Negative   Ketones, UA Negative Negative   RBC, UA Trace (A) Negative   Bilirubin, UA Negative Negative   Urobilinogen, Ur 0.2 0.2 - 1.0 mg/dL   Nitrite, UA Negative Negative   Microscopic Examination See below:       Assessment & Plan:   Problem List Items Addressed This Visit      Endocrine   Hypothyroidism    When she finishes the current brand name, we'll switch to generic, patient may call for new Rx; then recheck TSH 6-8 weeks after the change and starting the new generic form        Genitourinary   Urethral polyp    Refer to urologist for what I suspect is a urethral polyp      Relevant Orders   Ambulatory referral to Urology     Other   Thrombocytopenia ([place])    Nothing new says patient; sees GI; last CBC reviewed      Elevated serum glutamic pyruvic transaminase (SGPT) level    Previously elevated labs; she sees GI at [facility]      Pain, female pelvic   Relevant Orders   US Transvaginal Non-OB   US Pelvis Complete    Other Visit Diagnoses    Left sided abdominal pain    -  Primary    pressure, bloating sensation, will start with pelvic US; recently had colonoscopy    Relevant Orders    UA/M w/rflx Culture, Routine (Completed)       Follow up plan: Return 7-10 days, for follow-up if needed.  Orders Placed This Encounter  Procedures  . Microscopic Examination  . US Transvaginal Non-OB  . US Pelvis Complete  . UA/M w/rflx Culture, Routine  . Ambulatory referral to Urology   An after-visit summary was printed and given to the patient at [place].  Please see the patient instructions which may contain other information and recommendations beyond what is mentioned above in the assessment and plan.

## 2015-06-12 NOTE — Assessment & Plan Note (Addendum)
Nothing new says patient; sees GI; last CBC reviewed

## 2015-06-12 NOTE — Patient Instructions (Signed)
We'll get the ultrasound and refer you to urologist Call me if anything new Return in 7-10 days if needed

## 2015-06-12 NOTE — Assessment & Plan Note (Addendum)
Refer to urologist for what I suspect is a urethral polyp

## 2015-06-12 NOTE — Assessment & Plan Note (Addendum)
When she finishes the current brand name, we'll switch to generic, patient may call for new Rx; then recheck TSH 6-8 weeks after the change and starting the new generic form

## 2015-06-13 NOTE — Assessment & Plan Note (Signed)
Previously elevated labs; she sees GI at [facility]

## 2015-06-16 ENCOUNTER — Ambulatory Visit
Admission: RE | Admit: 2015-06-16 | Discharge: 2015-06-16 | Disposition: A | Discharge status: Home / Self Care | Payer: Medicare Other | Source: Ambulatory Visit | Attending: Family Medicine | Admitting: Family Medicine

## 2015-06-16 ENCOUNTER — Telehealth: Payer: Self-pay | Admitting: Family Medicine

## 2015-06-16 DIAGNOSIS — R102 Pelvic and perineal pain: Secondary | ICD-10-CM | POA: Diagnosis present

## 2015-06-16 MED ORDER — FLUTICASONE-SALMETEROL 250-50 MCG/DOSE IN AEPB: INHALATION_SPRAY | RESPIRATORY_TRACT | Status: AC

## 2015-06-16 NOTE — Telephone Encounter (Signed)
I talked with patient Explained [place] was completely negative We talked about her high liver enzymes; she went to GI and he was not excited about her liver enzymes; "why are you here" he asked; she went through this earlier; had the liver biopsy, even found nothing then; everybody keeps focusing on Crohn's and she says she has nothing like that; she had stool samples; he reviewed the scans; when I sent him back to him with the liver enzymes, he looked at scans from [place]; she had [place] a few months before that; he said the liver was a little enlarged, nothing to worry about; he sees people with lots worse livers than hers Colonoscopy was completely normal She agrees to try probiotics, call me or see me in one week; can then get CT scan, additional labs, etc. To work up her pressure; she is urinating fine; last urine reviewed; pt will keep me posted

## 2015-06-24 ENCOUNTER — Other Ambulatory Visit: Payer: Self-pay | Admitting: Family Medicine

## 2015-06-24 MED ORDER — CLONAZEPAM 0.5 MG PO TABS: 0.5000 mg | ORAL_TABLET | Freq: Every evening | ORAL | Status: DC | PRN

## 2015-06-24 NOTE — Progress Notes (Signed)
Sending Rx to [facility]

## 2015-07-01 ENCOUNTER — Ambulatory Visit: Payer: Medicare Other | Admitting: Urology

## 2015-07-01 DIAGNOSIS — R0602 Shortness of breath: Secondary | ICD-10-CM | POA: Diagnosis not present

## 2015-07-01 DIAGNOSIS — J449 Chronic obstructive pulmonary disease, unspecified: Secondary | ICD-10-CM | POA: Diagnosis not present

## 2015-07-03 ENCOUNTER — Ambulatory Visit: Payer: Medicare Other | Admitting: Urology

## 2015-07-09 ENCOUNTER — Telehealth: Payer: Self-pay | Admitting: Family Medicine

## 2015-07-09 NOTE — Telephone Encounter (Signed)
Routing to provider  

## 2015-07-09 NOTE — Telephone Encounter (Signed)
Let's get her in to see GI If the probiotics aren't working (I appreciate her trying), let's have her see gastroenterologist; does she want to call and schedule her own appt? Also, please ask her if she has seen urology about the polyp (see February referral); thanks

## 2015-07-09 NOTE — Telephone Encounter (Signed)
Patient notified, she will call her GI doctor and schedule an appointment. She has her urology appointment next week. Their office had to reschedule her.

## 2015-07-09 NOTE — Telephone Encounter (Signed)
Pt called and stated that the probiotics didn't seem to work and she would like to try something else.

## 2015-07-15 ENCOUNTER — Encounter: Payer: Self-pay | Admitting: Urology

## 2015-07-15 ENCOUNTER — Ambulatory Visit (INDEPENDENT_AMBULATORY_CARE_PROVIDER_SITE_OTHER): Payer: Medicare Other | Admitting: Urology

## 2015-07-15 VITALS — BP 159/85 | HR 101 | Ht 64.0 in | Wt 125.4 lb

## 2015-07-15 DIAGNOSIS — N362 Urethral caruncle: Secondary | ICD-10-CM | POA: Diagnosis not present

## 2015-07-15 LAB — URINALYSIS, COMPLETE
Bilirubin, UA: NEGATIVE
Glucose, UA: NEGATIVE
Ketones, UA: NEGATIVE
LEUKOCYTES UA: NEGATIVE
Nitrite, UA: NEGATIVE
PH UA: 5.5 (ref 5.0–7.5)
PROTEIN UA: NEGATIVE
RBC, UA: NEGATIVE
Specific Gravity, UA: 1.02 (ref 1.005–1.030)
Urobilinogen, Ur: 0.2 mg/dL (ref 0.2–1.0)

## 2015-07-15 LAB — MICROSCOPIC EXAMINATION: Bacteria, UA: NONE SEEN

## 2015-07-15 NOTE — Progress Notes (Signed)
07/15/2015 5:30 PM   Anna Lowe [birth year] [id number]  Referring provider: Arnetha Courser, MD [address] [place], [place] [address]  Chief Complaint  Patient presents with  . New Patient (Initial Visit)    urethral polyp    HPI: 70 yo F referred from Dr. Sanda Klein referred for further work up of possible urethral polyp.  She has been having left sided abdominal/ pelvic pain over the past year which comes and goes.   She has had further work up with transvaginal ultrasound which was negative.  She previously had an abdominal ultrasound and will be seeing GI.    No history of  Kidney stones.  No flank pain.  No UTIs.  No dysuria or gross hematuria.   No dyspareunia or vaginal bulging.    No SUI.  No voiding issues.  No vaginal/ urethral spotting.    UA negative today.     PMH: Past Medical History  Diagnosis Date  . Breast mass 08/27/2014    bilat 3:00 lumps  . Asthma   . COPD (chronic obstructive pulmonary disease) ([place])   . GERD (gastroesophageal reflux disease)   . Hyperlipidemia   . Hypertension   . Osteoporosis   . Anxiety   . Hypothyroid   . Cataract   . Arthritis     in hands  . Thrombopenia ([place])   . Elevated alkaline phosphatase level   . Elevated serum glutamic pyruvic transaminase (SGPT) level   . Hypokalemia   . Right hip pain   . Allergy   . Hyperalbuminemia   . Back pain   . History of hip fracture   . Abdominal pain   . Short leg syndrome, acquired   . History of liver biopsy Aug [id number]    with complications, acute hemorragic shock  . Hemorrhagic shock Aug 2015    s/p liver biopsy    Surgical History: Past Surgical History  Procedure Laterality Date  . Colonoscopy with propofol N/A 09/25/2014    Procedure: COLONOSCOPY WITH PROPOFOL;  Surgeon: Josefine Class, MD;  Location: [facility] ENDOSCOPY;  Service: Endoscopy;  Laterality: N/A;  . Appendectomy  1963  . Cataract extraction  1989    Home Medications:    Medication List       This list is  accurate as of: 07/15/15  5:30 PM.  Always use your most recent med list.               albuterol 108 (90 Base) MCG/ACT inhaler  Commonly known as:  PROVENTIL HFA;VENTOLIN HFA  Inhale 2 puffs into the lungs every 4 (four) hours as needed for wheezing or shortness of breath.     amLODipine 2.5 MG tablet  Commonly known as:  NORVASC  Take 1 tablet (2.5 mg total) by mouth daily.     cetirizine 10 MG tablet  Commonly known as:  ZYRTEC  Take 10 mg by mouth daily. Reported on 07/15/2015     clonazePAM 0.5 MG tablet  Commonly known as:  KLONOPIN  Take 1 tablet (0.5 mg total) by mouth at bedtime as needed.     ferrous sulfate 325 (65 FE) MG tablet  Take 325 mg by mouth daily with breakfast.     Fluticasone-Salmeterol 250-50 MCG/DOSE Aepb  Commonly known as:  ADVAIR DISKUS  USE 1 INHALATION TWO TIMES A DAY     ipratropium-albuterol 0.5-2.5 (3) MG/3ML Soln  Commonly known as:  DUONEB  Inhale into the lungs.  levothyroxine 75 MCG tablet  Commonly known as:  SYNTHROID  Take 1 tablet (75 mcg total) by mouth daily before breakfast. Brand name medically necessary; dispense as written SYNTHROID     multivitamin with minerals Tabs tablet  Take 1 tablet by mouth daily.     omeprazole 20 MG capsule  Commonly known as:  PRILOSEC  Take 1 capsule (20 mg total) by mouth daily as needed.     Vitamin D 2000 units Caps  Take 1 capsule by mouth daily.        Allergies:  Allergies  Allergen Reactions  . Bacitracin Swelling  . Aspirin     Low platets  . Band-Aid Liquid Bandage [Dermagran]     Band-Aid adhesive itchy  . Codeine Itching  . Other Rash    Latex     Family History: Family History  Problem Relation Age of Onset  . Cancer Mother     skin  . Hyperlipidemia Mother   . Hypertension Mother   . Heart disease Mother   . Emphysema Father   . Lung disease Father   . Diabetes Neg Hx   . COPD Neg Hx   . Stroke Neg Hx   . Hematuria Neg Hx     Social History:  reports  that she has never smoked. She has never used smokeless tobacco. She reports that she does not drink alcohol or use illicit drugs.  ROS: UROLOGY Frequent Urination?: No Hard to postpone urination?: No Burning/pain with urination?: No Get up at night to urinate?: Yes Leakage of urine?: No Urine stream starts and stops?: No Trouble starting stream?: No Do you have to strain to urinate?: No Blood in urine?: No Urinary tract infection?: No Sexually transmitted disease?: No Injury to kidneys or bladder?: No Painful intercourse?: No Weak stream?: No Currently pregnant?: No Vaginal bleeding?: No Last menstrual period?: Post  Gastrointestinal Nausea?: No Vomiting?: No Indigestion/heartburn?: Yes Diarrhea?: No Constipation?: No  Constitutional Fever: No Night sweats?: No Weight loss?: No Fatigue?: No  Skin Skin rash/lesions?: No Itching?: No  Eyes Blurred vision?: No Double vision?: No  Ears/Nose/Throat Sore throat?: No Sinus problems?: Yes  Hematologic/Lymphatic Swollen glands?: No Easy bruising?: Yes  Cardiovascular Leg swelling?: No Chest pain?: No  Respiratory Cough?: Yes Shortness of breath?: Yes  Endocrine Excessive thirst?: No  Musculoskeletal Back pain?: No Joint pain?: Yes  Neurological Headaches?: No Dizziness?: No  Psychologic Depression?: No Anxiety?: No  Physical Exam: BP 159/85 mmHg  Pulse 101  Ht 5\' 4"  (1.626 m)  Wt 125 lb 6.4 oz (56.881 kg)  BMI 21.51 kg/m2  Constitutional:  Alert and oriented, No acute distress. HEENT: [place] AT, moist mucus membranes.  Trachea midline, no masses. Cardiovascular: No clubbing, cyanosis, or edema. Respiratory: Normal respiratory effort, no increased work of breathing. GI: Abdomen is soft, nontender, nondistended, no abdominal masses GU: No CVA tenderness.  Pelvic: Normal external genitalia. Slightly atrophic vaginal mucosa. 5 mm urethral caruncle seen at 6:00 position on urethral meatus. No  evidence of pelvic organ prolapse, cystocele, rectocele. No stress urinary incontinence demonstrable with Valsalva. Skin: No rashes, bruises or suspicious lesions. Lymph: No cervical or inguinal adenopathy. Neurologic: Grossly intact, no focal deficits, moving all 4 extremities. Psychiatric: Normal mood and affect.  Laboratory Data: Lab Results  Component Value Date   WBC 7.0 12/26/2014   HGB 11.0* 12/12/2013   HCT 41.3 12/26/2014   MCV 94 12/26/2014   PLT 73* 12/26/2014    Lab Results  Component Value Date  CREATININE 1.01* 12/26/2014     Urinalysis UA today reviewed.  Negative for infection or blood.  See epic.    Pertinent Imaging: n/a  Assessment & Plan:    1. Urethral caruncle Discussed exam finding today consistent with benign urethral caruncle. She is otherwise asymptomatic. As such, I have not recommended any further intervention. We discussed that should she develop any urinary symptoms, dysuria, infections, bleeding, or irritation, medication in the form of topical estrogen cream could be prescribed. She will return as needed should she develop any issues moving forward.  - Urinalysis, Complete    Return if symptoms worsen or fail to improve.  Hollice Espy, MD  [facility] [address], [place], [place] [address] [phone]

## 2015-07-16 ENCOUNTER — Other Ambulatory Visit: Payer: Self-pay | Admitting: Gastroenterology

## 2015-07-16 DIAGNOSIS — K760 Fatty (change of) liver, not elsewhere classified: Secondary | ICD-10-CM | POA: Diagnosis not present

## 2015-07-16 DIAGNOSIS — Z8719 Personal history of other diseases of the digestive system: Secondary | ICD-10-CM | POA: Diagnosis not present

## 2015-07-16 DIAGNOSIS — R109 Unspecified abdominal pain: Secondary | ICD-10-CM | POA: Diagnosis not present

## 2015-07-16 DIAGNOSIS — R1032 Left lower quadrant pain: Secondary | ICD-10-CM

## 2015-07-22 ENCOUNTER — Ambulatory Visit
Admission: RE | Admit: 2015-07-22 | Discharge: 2015-07-22 | Disposition: A | Discharge status: Home / Self Care | Payer: Medicare Other | Source: Ambulatory Visit | Attending: Gastroenterology | Admitting: Gastroenterology

## 2015-07-22 DIAGNOSIS — N8189 Other female genital prolapse: Secondary | ICD-10-CM | POA: Insufficient documentation

## 2015-07-22 DIAGNOSIS — R160 Hepatomegaly, not elsewhere classified: Secondary | ICD-10-CM | POA: Insufficient documentation

## 2015-07-22 DIAGNOSIS — R1032 Left lower quadrant pain: Secondary | ICD-10-CM | POA: Insufficient documentation

## 2015-07-22 DIAGNOSIS — K449 Diaphragmatic hernia without obstruction or gangrene: Secondary | ICD-10-CM | POA: Insufficient documentation

## 2015-07-22 MED ORDER — IOPAMIDOL (ISOVUE-300) INJECTION 61%
100.0000 mL | Freq: Once | INTRAVENOUS | Status: AC | PRN
  Administered 2015-07-22: 100 mL via INTRAVENOUS

## 2015-08-13 ENCOUNTER — Other Ambulatory Visit: Payer: Self-pay

## 2015-08-13 ENCOUNTER — Other Ambulatory Visit: Payer: Self-pay | Admitting: Family Medicine

## 2015-08-13 MED ORDER — LEVOTHYROXINE SODIUM 75 MCG PO TABS: 75.0000 ug | ORAL_TABLET | Freq: Every day | ORAL | Status: DC

## 2015-08-13 NOTE — Telephone Encounter (Signed)
Requesting refill on Synthroid but would like generic brand. Please send to [email or web address]

## 2015-08-13 NOTE — Telephone Encounter (Signed)
Lab Results  Component Value Date   TSH 1.300 12/26/2014   Rx approved

## 2015-08-14 MED ORDER — LEVOTHYROXINE SODIUM 75 MCG PO TABS: 75.0000 ug | ORAL_TABLET | Freq: Every day | ORAL | Status: DC

## 2015-08-14 NOTE — Telephone Encounter (Signed)
Please change in note generic not brand name only not sure how to change?

## 2015-08-14 NOTE — Telephone Encounter (Signed)
Note does not say if pt or pharmacy is requesting the generic I called pt; she requested generic Last TSH normal, Sept 2016

## 2015-09-23 ENCOUNTER — Other Ambulatory Visit: Payer: Self-pay | Admitting: Family Medicine

## 2015-09-24 NOTE — Telephone Encounter (Signed)
Rx called in 

## 2015-10-03 DIAGNOSIS — M940 Chondrocostal junction syndrome [Tietze]: Secondary | ICD-10-CM | POA: Diagnosis not present

## 2015-10-15 ENCOUNTER — Other Ambulatory Visit: Payer: Self-pay | Admitting: Family Medicine

## 2015-10-15 NOTE — Telephone Encounter (Signed)
Refilled with caution added

## 2015-10-25 ENCOUNTER — Telehealth: Payer: Self-pay | Admitting: Family Medicine

## 2015-10-25 DIAGNOSIS — Z1239 Encounter for other screening for malignant neoplasm of breast: Secondary | ICD-10-CM | POA: Insufficient documentation

## 2015-10-25 DIAGNOSIS — M810 Age-related osteoporosis without current pathological fracture: Secondary | ICD-10-CM

## 2015-10-25 NOTE — Telephone Encounter (Signed)
Patient does not appear to have any upcoming appointments here Please see if she is going to stay with me or stay at [facility] If she plans to follow me, please ask her to make an appointment to be seen in the next 4 weeks Please ask her to too to schedule and have her DEXA scan and mammogram done Orders expired from December, so I'll enter new ones; thanks

## 2015-10-25 NOTE — Assessment & Plan Note (Signed)
Order DEXA 

## 2015-10-26 MED ORDER — CLONAZEPAM 0.5 MG PO TABS: 0.5000 mg | ORAL_TABLET | Freq: Every evening | ORAL | Status: DC | PRN

## 2015-10-26 NOTE — Telephone Encounter (Signed)
Patient scheduled appointment for 11-10-15. She only have about 8 pills left of Klonapin. She is asking for enough to last until appointment.

## 2015-10-26 NOTE — Telephone Encounter (Signed)
[place] website reviewed; limited Rx written; will talk to her about tapering at appt

## 2015-11-10 ENCOUNTER — Ambulatory Visit (INDEPENDENT_AMBULATORY_CARE_PROVIDER_SITE_OTHER): Payer: Medicare Other | Admitting: Family Medicine

## 2015-11-10 ENCOUNTER — Encounter: Payer: Self-pay | Admitting: Family Medicine

## 2015-11-10 VITALS — BP 138/82 | HR 99 | Temp 98.2°F | Resp 16 | Wt 124.0 lb

## 2015-11-10 DIAGNOSIS — K588 Other irritable bowel syndrome: Secondary | ICD-10-CM

## 2015-11-10 DIAGNOSIS — E785 Hyperlipidemia, unspecified: Secondary | ICD-10-CM

## 2015-11-10 DIAGNOSIS — R7401 Elevation of levels of liver transaminase levels: Secondary | ICD-10-CM

## 2015-11-10 DIAGNOSIS — G4700 Insomnia, unspecified: Secondary | ICD-10-CM

## 2015-11-10 DIAGNOSIS — Z5181 Encounter for therapeutic drug level monitoring: Secondary | ICD-10-CM

## 2015-11-10 DIAGNOSIS — I10 Essential (primary) hypertension: Secondary | ICD-10-CM

## 2015-11-10 DIAGNOSIS — E034 Atrophy of thyroid (acquired): Secondary | ICD-10-CM

## 2015-11-10 DIAGNOSIS — F411 Generalized anxiety disorder: Secondary | ICD-10-CM | POA: Diagnosis not present

## 2015-11-10 DIAGNOSIS — R748 Abnormal levels of other serum enzymes: Secondary | ICD-10-CM | POA: Diagnosis not present

## 2015-11-10 DIAGNOSIS — M810 Age-related osteoporosis without current pathological fracture: Secondary | ICD-10-CM

## 2015-11-10 DIAGNOSIS — R740 Nonspecific elevation of levels of transaminase and lactic acid dehydrogenase [LDH]: Secondary | ICD-10-CM | POA: Diagnosis not present

## 2015-11-10 DIAGNOSIS — E038 Other specified hypothyroidism: Secondary | ICD-10-CM | POA: Diagnosis not present

## 2015-11-10 DIAGNOSIS — Z79899 Other long term (current) drug therapy: Secondary | ICD-10-CM | POA: Diagnosis not present

## 2015-11-10 DIAGNOSIS — D696 Thrombocytopenia, unspecified: Secondary | ICD-10-CM

## 2015-11-10 DIAGNOSIS — K589 Irritable bowel syndrome without diarrhea: Secondary | ICD-10-CM

## 2015-11-10 HISTORY — DX: Irritable bowel syndrome, unspecified: K58.9

## 2015-11-10 HISTORY — DX: Insomnia, unspecified: G47.00

## 2015-11-10 MED ORDER — SERTRALINE HCL 50 MG PO TABS: ORAL_TABLET | ORAL | Status: DC

## 2015-11-10 MED ORDER — CLONAZEPAM 0.5 MG PO TABS: 0.5000 mg | ORAL_TABLET | Freq: Every evening | ORAL | Status: DC | PRN

## 2015-11-10 NOTE — Assessment & Plan Note (Signed)
Check vit D 

## 2015-11-10 NOTE — Assessment & Plan Note (Signed)
Checked out by GI and no worries per patient

## 2015-11-10 NOTE — Assessment & Plan Note (Signed)
Check CBC 

## 2015-11-10 NOTE — Patient Instructions (Addendum)
Return for fasting labs this week Try to limit saturated fats in your diet (bologna, hot dogs, barbeque, cheeseburgers, hamburgers, steak, bacon, sausage, cheese, etc.) and get more fresh fruits, vegetables, and whole grains Your goal blood pressure is less than 140 mmHg on top. Try to follow the DASH guidelines (DASH stands for Dietary Approaches to Stop Hypertension) Try to limit the sodium in your diet.  Ideally, consume less than 1.5 grams (less than 1,500mg ) per day. Do not add salt when cooking or at the table.  Check the sodium amount on labels when shopping, and choose items lower in sodium when given a choice. Avoid or limit foods that already contain a lot of sodium. Eat a diet rich in fruits and vegetables and whole grains. Use nasal saline spray or rinse Use cognitive behavioral therapy for insomnia Insomnia Insomnia is a sleep disorder that makes it difficult to fall asleep or to stay asleep. Insomnia can cause tiredness (fatigue), low energy, difficulty concentrating, mood swings, and poor performance at work or school.  There are three different ways to classify insomnia:  Difficulty falling asleep.  Difficulty staying asleep.  Waking up too early in the morning. Any type of insomnia can be long-term (chronic) or short-term (acute). Both are common. Short-term insomnia usually lasts for three months or less. Chronic insomnia occurs at least three times a week for longer than three months. CAUSES  Insomnia may be caused by another condition, situation, or substance, such as:  Anxiety.  Certain medicines.  Gastroesophageal reflux disease (GERD) or other gastrointestinal conditions.  Asthma or other breathing conditions.  Restless legs syndrome, sleep apnea, or other sleep disorders.  Chronic pain.  Menopause. This may include hot flashes.  Stroke.  Abuse of alcohol, tobacco, or illegal drugs.  Depression.  Caffeine.   Neurological disorders, such as Alzheimer  disease.  An overactive thyroid (hyperthyroidism). The cause of insomnia may not be known. RISK FACTORS Risk factors for insomnia include:  Gender. Women are more commonly affected than men.  Age. Insomnia is more common as you get older.  Stress. This may involve your professional or personal life.  Income. Insomnia is more common in people with lower income.  Lack of exercise.   Irregular work schedule or night shifts.  Traveling between different time zones. SIGNS AND SYMPTOMS If you have insomnia, trouble falling asleep or trouble staying asleep is the main symptom. This may lead to other symptoms, such as:  Feeling fatigued.  Feeling nervous about going to sleep.  Not feeling rested in the morning.  Having trouble concentrating.  Feeling irritable, anxious, or depressed. TREATMENT  Treatment for insomnia depends on the cause. If your insomnia is caused by an underlying condition, treatment will focus on addressing the condition. Treatment may also include:   Medicines to help you sleep.  Counseling or therapy.  Lifestyle adjustments. HOME CARE INSTRUCTIONS   Take medicines only as directed by your health care provider.  Keep regular sleeping and waking hours. Avoid naps.  Keep a sleep diary to help you and your health care provider figure out what could be causing your insomnia. Include:   When you sleep.  When you wake up during the night.  How well you sleep.   How rested you feel the next day.  Any side effects of medicines you are taking.  What you eat and drink.   Make your bedroom a comfortable place where it is easy to fall asleep:  Put up shades or special blackout  curtains to block light from outside.  Use a white noise machine to block noise.  Keep the temperature cool.   Exercise regularly as directed by your health care provider. Avoid exercising right before bedtime.  Use relaxation techniques to manage stress. Ask your  health care provider to suggest some techniques that may work well for you. These may include:  Breathing exercises.  Routines to release muscle tension.  Visualizing peaceful scenes.  Cut back on alcohol, caffeinated beverages, and cigarettes, especially close to bedtime. These can disrupt your sleep.  Do not overeat or eat spicy foods right before bedtime. This can lead to digestive discomfort that can make it hard for you to sleep.  Limit screen use before bedtime. This includes:  Watching TV.  Using your smartphone, tablet, and computer.  Stick to a routine. This can help you fall asleep faster. Try to do a quiet activity, brush your teeth, and go to bed at the same time each night.  Get out of bed if you are still awake after 15 minutes of trying to sleep. Keep the lights down, but try reading or doing a quiet activity. When you feel sleepy, go back to bed.  Make sure that you drive carefully. Avoid driving if you feel very sleepy.  Keep all follow-up appointments as directed by your health care provider. This is important. SEEK MEDICAL CARE IF:   You are tired throughout the day or have trouble in your daily routine due to sleepiness.  You continue to have sleep problems or your sleep problems get worse. SEEK IMMEDIATE MEDICAL CARE IF:   You have serious thoughts about hurting yourself or someone else.   This information is not intended to replace advice given to you by your health care provider. Make sure you discuss any questions you have with your health care provider.   Document Released: 04/08/2000 Document Revised: 12/31/2014 Document Reviewed: 01/10/2014 Elsevier Interactive Patient Education [employer].

## 2015-11-10 NOTE — Assessment & Plan Note (Signed)
Check fasting lipids on another day; limit saturated fats and fried foods

## 2015-11-10 NOTE — Assessment & Plan Note (Signed)
Check TSH and free T4 

## 2015-11-10 NOTE — Assessment & Plan Note (Signed)

## 2015-11-10 NOTE — Assessment & Plan Note (Signed)
Add low dose sertraline and see if it helps

## 2015-11-10 NOTE — Assessment & Plan Note (Signed)
On benzo; encouraged her to use less and less over coming few months; try CBT-I, list of counselors given

## 2015-11-10 NOTE — Progress Notes (Signed)
BP 138/82 mmHg  Pulse 99  Temp(Src) 98.2 F (36.8 C) (Oral)  Resp 16  Wt 124 lb (56.246 kg)  SpO2 96%   Subjective:    Patient ID: Anna Lowe, female    DOB: 1945/12/08, 70 y.o.   MRN: [id number]  HPI: Anna Lowe is a 70 y.o. female  Chief Complaint  Patient presents with  . Medication Refill   Since last visit, she has been back to gastroenterologist; they found absolutely nothing She has done some exploring on her own and thinks she has irritable bowel syndrome; she had scans and labs; patient thinks she has IBS; they called her and said she's "good to go"; the pain is still there; she was told they can't do anything else for her, per patient; she started to put things together as to when her stomach acts up; she can't do carbonated drinks; no sodas now; mounds bar once in a while; cabbage bothers her, some veggies bother her; she's keeping track; loose but not diarrhea; never constipated  Anxiety: Patient complains of sleep disturbance.  She has the following symptoms: irritable, psychomotor agitation. On medicine since her thyroid problem was diagnosed. Onset of symptoms was approximately since 1995 , years ago ago, unchanged since that time. She denies current suicidal and homicidal ideation. Family history significant for no psychiatric illness.Possible organic causes contributing are: endocrine/metabolic. Risk factors: none Previous treatment includes clonazepam and none.  She complains of the following side effects from the treatment: none. Last done of clonazepam was last night and using every night; no alcohol; no narcotic pain medicine  Thyroid: Patient presents for evaluation of hypothyroidism.  Just before I moved, we talked about her going to generic medicine; the last Rx was sent as "GENERIC" on 08/14/15; she is currently taking brand name because it was only $12  HTN; controlled today  High cholesterol; did not come fasting for labs; return on another day for  labs; not eating fried foods; rare cheeseburgers; no fast foods; no eggs  She thought she was coming down with a sinus infection or bronchitis; started with scratchy through and cough; then bad equilibrium;   Depression screen [facility] 2/9 11/10/2015  Decreased Interest 0  Down, Depressed, Hopeless 0  PHQ - 2 Score 0    No flowsheet data found.  Relevant past medical, surgical, family and social history reviewed Past Medical History  Diagnosis Date  . Breast mass 08/27/2014    bilat 3:00 lumps  . Asthma   . COPD (chronic obstructive pulmonary disease) ([place])   . GERD (gastroesophageal reflux disease)   . Hyperlipidemia   . Hypertension   . Osteoporosis   . Anxiety   . Hypothyroid   . Cataract   . Arthritis     in hands  . Thrombopenia ([place])   . Elevated alkaline phosphatase level   . Elevated serum glutamic pyruvic transaminase (SGPT) level   . Hypokalemia   . Right hip pain   . Allergy   . Hyperalbuminemia   . Back pain   . History of hip fracture   . Abdominal pain   . Short leg syndrome, acquired   . History of liver biopsy Aug 123456    with complications, acute hemorragic shock  . Hemorrhagic shock Aug 2015    s/p liver biopsy  . Irritable bowel syndrome 11/10/2015  . Insomnia 11/10/2015   Past Surgical History  Procedure Laterality Date  . Colonoscopy with propofol N/A 09/25/2014    Procedure:  COLONOSCOPY WITH PROPOFOL;  Surgeon: Josefine Class, MD;  Location: [facility] ENDOSCOPY;  Service: Endoscopy;  Laterality: N/A;  . Appendectomy  1963  . Cataract extraction  1989   Family History  Problem Relation Age of Onset  . Cancer Mother     skin  . Hyperlipidemia Mother   . Hypertension Mother   . Heart disease Mother   . Emphysema Father   . Lung disease Father   . Diabetes Neg Hx   . COPD Neg Hx   . Stroke Neg Hx   . Hematuria Neg Hx    Social History  Substance Use Topics  . Smoking status: Never Smoker   . Smokeless tobacco: Never Used  . Alcohol Use: No     Interim medical history since last visit reviewed. Allergies and medications reviewed  Review of Systems Per HPI unless specifically indicated above     Objective:    BP 138/82 mmHg  Pulse 99  Temp(Src) 98.2 F (36.8 C) (Oral)  Resp 16  Wt 124 lb (56.246 kg)  SpO2 96%  Wt Readings from Last 3 Encounters:  11/10/15 124 lb (56.246 kg)  07/15/15 125 lb 6.4 oz (56.881 kg)  06/12/15 124 lb 9.6 oz (56.518 kg)    Physical Exam  Constitutional: She appears well-developed and well-nourished. No distress.  HENT:  Head: Normocephalic and atraumatic.  Mouth/Throat: She has dentures.  Cerumen in both canals, right worse than left  Eyes: EOM are normal. No scleral icterus.  Neck: No thyromegaly present.  Cardiovascular: Normal rate, regular rhythm and normal heart sounds.   No murmur heard. Pulmonary/Chest: Effort normal and breath sounds normal. No respiratory distress. She has no wheezes.  Abdominal: Soft. Bowel sounds are normal. She exhibits no distension.  Musculoskeletal: Normal range of motion. She exhibits no edema.  Neurological: She is alert. She exhibits normal muscle tone.  Skin: Skin is warm and dry. She is not diaphoretic. No pallor.  Psychiatric: She has a normal mood and affect. Her behavior is normal. Judgment and thought content normal.  Good eye contact with examiner    Results for orders placed or performed in visit on 07/15/15  Microscopic Examination  Result Value Ref Range   WBC, UA 0-5 0 -  5 /hpf   RBC, UA 0-2 0 -  2 /hpf   Epithelial Cells (non renal) 0-10 0 - 10 /hpf   Mucus, UA Present (A) Not Estab.   Bacteria, UA None seen None seen/Few  Urinalysis, Complete  Result Value Ref Range   Specific Gravity, UA 1.020 1.005 - 1.030   pH, UA 5.5 5.0 - 7.5   Color, UA Yellow Yellow   Appearance Ur Clear Clear   Leukocytes, UA Negative Negative   Protein, UA Negative Negative/Trace   Glucose, UA Negative Negative   Ketones, UA Negative Negative    RBC, UA Negative Negative   Bilirubin, UA Negative Negative   Urobilinogen, Ur 0.2 0.2 - 1.0 mg/dL   Nitrite, UA Negative Negative   Microscopic Examination See below:       Assessment & Plan:   Problem List Items Addressed This Visit      Cardiovascular and Mediastinum   Hypertension    Controlled today        Digestive   Irritable bowel syndrome    Add low dose sertraline and see if it helps        Endocrine   Adult hypothyroidism    Check TSH and free T4  Relevant Orders   TSH     Musculoskeletal and Integument   Osteoporosis    Check vit D      Relevant Orders   VITAMIN D 25 Hydroxy (Vit-D Deficiency, Fractures)     Other   Thrombocytopenia (HCC)    Check CBC      Relevant Orders   CBC with Differential/Platelet   Medication monitoring encounter   Relevant Orders   Comprehensive Metabolic Panel (CMET)   Insomnia    On benzo; encouraged her to use less and less over coming few months; try CBT-I, list of counselors given      Hyperlipidemia    Check fasting lipids on another day; limit saturated fats and fried foods      Relevant Orders   Lipid panel   Generalized anxiety disorder - Primary    With IBS; will start low-dose SSRI to see if this helps      Elevated serum glutamic pyruvic transaminase (SGPT) level    Checked out by GI and no worries per patient      Elevated alkaline phosphatase level    Checked out by GI and no worries per patient      Controlled substance agreement signed    Discussed risk of controlled substances including possible unintentional overdose, especially if mixed with alcohol or other pills; typical speech given including illegal to share, even out of the goodness of patient's heart, always keep in the original bottle, safeguard medicine, do NOT mix with alcohol, other pain pills, "nerve" or anxiety pills, or sleeping pills; I am not obligated to approve of early refill or give new prescription if medicine is  lost, stolen, or destroyed even with a police report, etc.; patient agrees with plan; controlled substance contract signed; copy of contract given to patient         Follow up plan: Return in about 3 months (around 02/10/2016) for medication management.  An after-visit summary was printed and given to the patient at [place].  Please see the patient instructions which may contain other information and recommendations beyond what is mentioned above in the assessment and plan.  Meds ordered this encounter  Medications  . sertraline (ZOLOFT) 50 MG tablet    Sig: One-half of a pill by mouth daily x 2 weeks, then one whole pill daily    Dispense:  21 tablet    Refill:  0  . clonazePAM (KLONOPIN) 0.5 MG tablet    Sig: Take 1 tablet (0.5 mg total) by mouth at bedtime as needed.    Dispense:  30 tablet    Refill:  2    [facility]    Orders Placed This Encounter  Procedures  . VITAMIN D 25 Hydroxy (Vit-D Deficiency, Fractures)  . Comprehensive Metabolic Panel (CMET)  . TSH  . CBC with Differential/Platelet  . Lipid panel

## 2015-11-10 NOTE — Assessment & Plan Note (Signed)
Controlled today 

## 2015-11-10 NOTE — Assessment & Plan Note (Addendum)
With IBS; will start low-dose SSRI to see if this helps

## 2015-11-15 ENCOUNTER — Other Ambulatory Visit: Payer: Self-pay | Admitting: Family Medicine

## 2015-11-24 ENCOUNTER — Other Ambulatory Visit: Payer: Medicare Other

## 2015-11-24 ENCOUNTER — Ambulatory Visit: Payer: Medicare Other

## 2015-12-08 ENCOUNTER — Encounter: Payer: Self-pay | Admitting: Family Medicine

## 2015-12-14 ENCOUNTER — Other Ambulatory Visit: Payer: Self-pay | Admitting: Family Medicine

## 2015-12-15 ENCOUNTER — Ambulatory Visit
Admission: RE | Admit: 2015-12-15 | Discharge: 2015-12-15 | Disposition: A | Discharge status: Home / Self Care | Payer: Medicare Other | Source: Ambulatory Visit | Attending: Family Medicine | Admitting: Family Medicine

## 2015-12-15 DIAGNOSIS — Z13820 Encounter for screening for osteoporosis: Secondary | ICD-10-CM | POA: Diagnosis not present

## 2015-12-15 DIAGNOSIS — M810 Age-related osteoporosis without current pathological fracture: Secondary | ICD-10-CM | POA: Diagnosis not present

## 2015-12-15 DIAGNOSIS — Z1239 Encounter for other screening for malignant neoplasm of breast: Secondary | ICD-10-CM | POA: Diagnosis not present

## 2015-12-15 DIAGNOSIS — Z780 Asymptomatic menopausal state: Secondary | ICD-10-CM | POA: Diagnosis not present

## 2015-12-15 DIAGNOSIS — J449 Chronic obstructive pulmonary disease, unspecified: Secondary | ICD-10-CM | POA: Insufficient documentation

## 2015-12-15 DIAGNOSIS — Z1231 Encounter for screening mammogram for malignant neoplasm of breast: Secondary | ICD-10-CM | POA: Insufficient documentation

## 2015-12-15 DIAGNOSIS — Z8262 Family history of osteoporosis: Secondary | ICD-10-CM | POA: Diagnosis not present

## 2015-12-15 HISTORY — DX: Malignant (primary) neoplasm, unspecified: C80.1

## 2015-12-29 ENCOUNTER — Telehealth: Payer: Self-pay | Admitting: Family Medicine

## 2015-12-29 DIAGNOSIS — M810 Age-related osteoporosis without current pathological fracture: Secondary | ICD-10-CM

## 2015-12-30 NOTE — Telephone Encounter (Signed)
She took [place] and she will not anywhere near that again; helped for the first 2 years, then made it worse She used prolia for 2.5 years Refer to endo We discussed fall precautions Calcium three servings a day Vitamin D3 1000 iu daily

## 2015-12-30 NOTE — Assessment & Plan Note (Signed)
Patient has been on Prolia in the past; refuses bisphosphonate; miacalcin would not be helpful for her hip; will refer to endo for eval and treatment, reconsider Prolia

## 2016-01-11 DIAGNOSIS — Z23 Encounter for immunization: Secondary | ICD-10-CM | POA: Diagnosis not present

## 2016-01-21 DIAGNOSIS — L821 Other seborrheic keratosis: Secondary | ICD-10-CM | POA: Diagnosis not present

## 2016-01-21 DIAGNOSIS — L0292 Furuncle, unspecified: Secondary | ICD-10-CM | POA: Diagnosis not present

## 2016-01-21 DIAGNOSIS — L570 Actinic keratosis: Secondary | ICD-10-CM | POA: Diagnosis not present

## 2016-01-21 DIAGNOSIS — X32XXXA Exposure to sunlight, initial encounter: Secondary | ICD-10-CM | POA: Diagnosis not present

## 2016-01-21 DIAGNOSIS — D1801 Hemangioma of skin and subcutaneous tissue: Secondary | ICD-10-CM | POA: Diagnosis not present

## 2016-01-26 ENCOUNTER — Other Ambulatory Visit: Payer: Self-pay | Admitting: Family Medicine

## 2016-01-26 MED ORDER — RANITIDINE HCL 150 MG PO TABS: 150.0000 mg | ORAL_TABLET | Freq: Two times a day (BID) | ORAL | 5 refills | Status: DC

## 2016-01-26 NOTE — Telephone Encounter (Signed)
Please encourage patient to start weaning down the omeprazole There are new cautions regarding this drug, and they now recommend no more than 8 weeks of treatment in seniors 65 and over Start to skip one or two days a week, then take just every other day and gradually come off START ranitidine 150 mg BID which does not have same warnings and risks; she can take that on the same day she takes the omeprazole

## 2016-01-26 NOTE — Telephone Encounter (Signed)
I'm so sorry, but please have her at least try; even if she starts the ranitidine and then skips just Sundays, give that a try at first; then she can skip Wednesdays and Sundays; we have to try to decrease this drug

## 2016-01-26 NOTE — Telephone Encounter (Signed)
Patients states you have done this before and the zantac does not work for her?

## 2016-01-27 NOTE — Telephone Encounter (Signed)
Left voice mail

## 2016-02-05 ENCOUNTER — Encounter: Payer: Self-pay | Admitting: Family Medicine

## 2016-02-05 ENCOUNTER — Ambulatory Visit (INDEPENDENT_AMBULATORY_CARE_PROVIDER_SITE_OTHER): Payer: Medicare Other | Admitting: Family Medicine

## 2016-02-05 DIAGNOSIS — D696 Thrombocytopenia, unspecified: Secondary | ICD-10-CM

## 2016-02-05 DIAGNOSIS — K219 Gastro-esophageal reflux disease without esophagitis: Secondary | ICD-10-CM | POA: Diagnosis not present

## 2016-02-05 DIAGNOSIS — F5101 Primary insomnia: Secondary | ICD-10-CM

## 2016-02-05 DIAGNOSIS — I10 Essential (primary) hypertension: Secondary | ICD-10-CM

## 2016-02-05 DIAGNOSIS — E039 Hypothyroidism, unspecified: Secondary | ICD-10-CM | POA: Diagnosis not present

## 2016-02-05 DIAGNOSIS — E782 Mixed hyperlipidemia: Secondary | ICD-10-CM | POA: Diagnosis not present

## 2016-02-05 DIAGNOSIS — R7401 Elevation of levels of liver transaminase levels: Secondary | ICD-10-CM

## 2016-02-05 DIAGNOSIS — R740 Nonspecific elevation of levels of transaminase and lactic acid dehydrogenase [LDH]: Secondary | ICD-10-CM | POA: Diagnosis not present

## 2016-02-05 MED ORDER — RANITIDINE HCL 300 MG PO CAPS: 300.0000 mg | ORAL_CAPSULE | Freq: Every evening | ORAL | 3 refills | Status: DC

## 2016-02-05 MED ORDER — LEVOTHYROXINE SODIUM 75 MCG PO TABS: 75.0000 ug | ORAL_TABLET | Freq: Every day | ORAL | 1 refills | Status: DC

## 2016-02-05 MED ORDER — CLONAZEPAM 0.5 MG PO TABS: 0.5000 mg | ORAL_TABLET | Freq: Every evening | ORAL | 2 refills | Status: DC | PRN

## 2016-02-05 NOTE — Progress Notes (Signed)
BP 134/82   Pulse 78   Temp 98.2 F (36.8 C) (Oral)   Resp 14   Wt 125 lb (56.7 kg)   SpO2 96%   BMI 21.46 kg/m    Subjective:    Patient ID: Anna Lowe, female    DOB: 08-06-1945, 70 y.o.   MRN: [id number]  HPI: Anna Lowe is a 70 y.o. female  Chief Complaint  Patient presents with  . Medication Refill   Patient is here for follow-up  She has GERD; she did not get the ranitidine because it was $27; already avoiding all trigger; no bloos in the stool; no excessive burping or gas; raw onions are a trigger; drinks coffee, nothing caffeinated  She is needing refill of clonazepam; medicine does not make her feel goofy, or over-medicated; no alcohol  HTN; well-controlled today on low dose CCB; no problems reported of constipation of swelling in the ankles  She has hypothyroidism; has been on stable dose of thyroid med for years; weight is stable  Lab Results  Component Value Date   TSH 1.300 12/26/2014   She has high cholesterol; she did not come fasting today  Lab Results  Component Value Date   CHOL 201 (H) 12/26/2014   Lab Results  Component Value Date   HDL 57 12/26/2014   Lab Results  Component Value Date   LDLCALC 116 (H) 12/26/2014   Lab Results  Component Value Date   TRIG 142 12/26/2014   No results found for: CHOLHDL No results found for: LDLDIRECT   Depression screen [facility] 2/9 02/05/2016 11/10/2015  Decreased Interest 0 0  Down, Depressed, Hopeless 0 0  PHQ - 2 Score 0 0   Relevant past medical, surgical, family and social history reviewed Past Medical History:  Diagnosis Date  . Abdominal pain   . Allergy   . Anxiety   . Arthritis    in hands  . Asthma   . Back pain   . Breast mass 08/27/2014   bilat 3:00 lumps  . Cancer ([place])    skin ca  . Cataract   . COPD (chronic obstructive pulmonary disease) ([place])   . Elevated alkaline phosphatase level   . Elevated serum glutamic pyruvic transaminase (SGPT) level   . GERD  (gastroesophageal reflux disease)   . Hemorrhagic shock Aug 2015   s/p liver biopsy  . History of hip fracture   . History of liver biopsy Aug 123456   with complications, acute hemorragic shock  . Hyperalbuminemia   . Hyperlipidemia   . Hypertension   . Hypokalemia   . Hypothyroid   . Insomnia 11/10/2015  . Irritable bowel syndrome 11/10/2015  . Osteoporosis   . Right hip pain   . Short leg syndrome, acquired   . Thrombopenia [facility])    Past Surgical History:  Procedure Laterality Date  . APPENDECTOMY  1963  . CATARACT EXTRACTION  1989  . COLONOSCOPY WITH PROPOFOL N/A 09/25/2014   Procedure: COLONOSCOPY WITH PROPOFOL;  Surgeon: Josefine Class, MD;  Location: [facility] ENDOSCOPY;  Service: Endoscopy;  Laterality: N/A;   Family History  Problem Relation Age of Onset  . Cancer Mother     skin  . Hyperlipidemia Mother   . Hypertension Mother   . Heart disease Mother   . Emphysema Father   . Lung disease Father   . Diabetes Neg Hx   . COPD Neg Hx   . Stroke Neg Hx   . Hematuria Neg Hx   .  Breast cancer Neg Hx    Social History  Substance Use Topics  . Smoking status: Never Smoker  . Smokeless tobacco: Never Used  . Alcohol use No   Interim medical history since last visit reviewed. Allergies and medications reviewed  Review of Systems Per HPI unless specifically indicated above     Objective:    BP 134/82   Pulse 78   Temp 98.2 F (36.8 C) (Oral)   Resp 14   Wt 125 lb (56.7 kg)   SpO2 96%   BMI 21.46 kg/m   Wt Readings from Last 3 Encounters:  02/05/16 125 lb (56.7 kg)  11/10/15 124 lb (56.2 kg)  07/15/15 125 lb 6.4 oz (56.9 kg)    Physical Exam  Constitutional: She appears well-developed and well-nourished. No distress.  HENT:  Head: Normocephalic and atraumatic.  Eyes: EOM are normal. No scleral icterus.  Neck: No thyromegaly present.  Cardiovascular: Normal rate, regular rhythm and normal heart sounds.   No murmur heard. Pulmonary/Chest: Effort  normal and breath sounds normal. No respiratory distress. She has no wheezes.  Abdominal: Soft. Bowel sounds are normal. She exhibits no distension.  Musculoskeletal: Normal range of motion. She exhibits no edema.  Neurological: She is alert. She exhibits normal muscle tone.  Skin: Skin is warm and dry. She is not diaphoretic. No pallor.  Psychiatric: She has a normal mood and affect. Her behavior is normal. Judgment and thought content normal.    Results for orders placed or performed in visit on 07/15/15  Microscopic Examination  Result Value Ref Range   WBC, UA 0-5 0 - 5 /hpf   RBC, UA 0-2 0 - 2 /hpf   Epithelial Cells (non renal) 0-10 0 - 10 /hpf   Mucus, UA Present (A) Not Estab.   Bacteria, UA None seen None seen/Few  Urinalysis, Complete  Result Value Ref Range   Specific Gravity, UA 1.020 1.005 - 1.030   pH, UA 5.5 5.0 - 7.5   Color, UA Yellow Yellow   Appearance Ur Clear Clear   Leukocytes, UA Negative Negative   Protein, UA Negative Negative/Trace   Glucose, UA Negative Negative   Ketones, UA Negative Negative   RBC, UA Negative Negative   Bilirubin, UA Negative Negative   Urobilinogen, Ur 0.2 0.2 - 1.0 mg/dL   Nitrite, UA Negative Negative   Microscopic Examination See below:       Assessment & Plan:   Problem List Items Addressed This Visit      Cardiovascular and Mediastinum   Hypertension (Chronic)    Well-controlled on low-dose CCB; continue same, try to follow DASH guidelines        Digestive   GERD (gastroesophageal reflux disease) (Chronic)    Discussed risks of prolonged use of PPI, including pneumonia, C diff colitis, anemia, osteoporosis, kidney damage, heart disease, electrolyte abnormalities; will try to use H2 blocker instead and wean down/off the PPI; avoid triggers      Relevant Medications   ranitidine (ZANTAC) 300 MG capsule     Endocrine   Adult hypothyroidism (Chronic)    Check TSH and adjust medicine dose if needed      Relevant  Medications   levothyroxine (SYNTHROID) 75 MCG tablet   Other Relevant Orders   TSH     Other   Thrombocytopenia (HCC) (Chronic)    Chronic; monitor periodically; no reports of problematic bleeding      Relevant Orders   CBC with Differential/Platelet   Insomnia (Chronic)  Patient has been weaned down on the benzo; [place] web site reviewed; no red flags; absolutely no alcohol with benzo; reviewed no pain medicine either, Aug 2016 FDA warning cited; okay for refills in between visits, patient to be seen every 3 months while on controlled substance      Hyperlipidemia (Chronic)    Check lipid panel on another day when she can return fasting      Relevant Orders   Lipid panel   Elevated serum glutamic pyruvic transaminase (SGPT) level    Monitor labs; has been to see GI      Relevant Orders   COMPLETE METABOLIC PANEL WITH GFR    Other Visit Diagnoses   None.      Follow up plan: Return in about 3 months (around 05/07/2016) for medication management.  An after-visit summary was printed and given to the patient at [place].  Please see the patient instructions which may contain other information and recommendations beyond what is mentioned above in the assessment and plan.  Meds ordered this encounter  Medications  . ranitidine (ZANTAC) 300 MG capsule    Sig: Take 1 capsule (300 mg total) by mouth every evening.    Dispense:  90 capsule    Refill:  3  . clonazePAM (KLONOPIN) 0.5 MG tablet    Sig: Take 1 tablet (0.5 mg total) by mouth at bedtime as needed.    Dispense:  30 tablet    Refill:  2    [facility]  . levothyroxine (SYNTHROID) 75 MCG tablet    Sig: Take 1 tablet (75 mcg total) by mouth daily before breakfast.    Dispense:  90 tablet    Refill:  1    *GENERIC*    Orders Placed This Encounter  Procedures  . TSH  . CBC with Differential/Platelet  . Lipid panel  . COMPLETE METABOLIC PANEL WITH GFR

## 2016-02-05 NOTE — Patient Instructions (Signed)
Let's try the new ranitidine 300 mg every evening Avoid the triggers Your other medicines will be at the pharmacy for pick-up Return for fasting labs next week some time at your convenience Try to limit saturated fats in your diet (bologna, hot dogs, barbeque, cheeseburgers, hamburgers, steak, bacon, sausage, cheese, etc.) and get more fresh fruits, vegetables, and whole grains

## 2016-02-09 DIAGNOSIS — J439 Emphysema, unspecified: Secondary | ICD-10-CM | POA: Diagnosis not present

## 2016-02-12 NOTE — Assessment & Plan Note (Signed)
Check lipid panel on another day when she can return fasting

## 2016-02-12 NOTE — Assessment & Plan Note (Signed)
Check TSH and adjust medicine dose if needed

## 2016-02-12 NOTE — Assessment & Plan Note (Signed)
Patient has been weaned down on the benzo; [place] web site reviewed; no red flags; absolutely no alcohol with benzo; reviewed no pain medicine either, Aug 2016 FDA warning cited; okay for refills in between visits, patient to be seen every 3 months while on controlled substance

## 2016-02-12 NOTE — Assessment & Plan Note (Signed)
Well-controlled on low-dose CCB; continue same, try to follow DASH guidelines

## 2016-02-12 NOTE — Assessment & Plan Note (Signed)
Discussed risks of prolonged use of PPI, including pneumonia, C diff colitis, anemia, osteoporosis, kidney damage, heart disease, electrolyte abnormalities; will try to use H2 blocker instead and wean down/off the PPI; avoid triggers

## 2016-02-12 NOTE — Assessment & Plan Note (Addendum)
Monitor labs; has been to see GI

## 2016-02-12 NOTE — Assessment & Plan Note (Signed)
Chronic; monitor periodically; no reports of problematic bleeding

## 2016-02-20 DIAGNOSIS — J449 Chronic obstructive pulmonary disease, unspecified: Secondary | ICD-10-CM | POA: Diagnosis not present

## 2016-02-20 DIAGNOSIS — R05 Cough: Secondary | ICD-10-CM | POA: Diagnosis not present

## 2016-02-25 ENCOUNTER — Other Ambulatory Visit: Payer: Self-pay

## 2016-02-25 DIAGNOSIS — R7401 Elevation of levels of liver transaminase levels: Secondary | ICD-10-CM

## 2016-02-25 DIAGNOSIS — D696 Thrombocytopenia, unspecified: Secondary | ICD-10-CM

## 2016-02-25 DIAGNOSIS — E039 Hypothyroidism, unspecified: Secondary | ICD-10-CM

## 2016-02-25 DIAGNOSIS — E782 Mixed hyperlipidemia: Secondary | ICD-10-CM | POA: Diagnosis not present

## 2016-02-25 DIAGNOSIS — R740 Nonspecific elevation of levels of transaminase and lactic acid dehydrogenase [LDH]: Secondary | ICD-10-CM | POA: Diagnosis not present

## 2016-02-25 LAB — CBC WITH DIFFERENTIAL/PLATELET
BASOS PCT: 0 %
Basophils Absolute: 0 cells/uL (ref 0–200)
EOS ABS: 0 {cells}/uL — AB (ref 15–500)
Eosinophils Relative: 0 %
HEMATOCRIT: 44.3 % (ref 35.0–45.0)
HEMOGLOBIN: 14.7 g/dL (ref 11.7–15.5)
LYMPHS ABS: 1638 {cells}/uL (ref 850–3900)
Lymphocytes Relative: 14 %
MCH: 32.1 pg (ref 27.0–33.0)
MCHC: 33.2 g/dL (ref 32.0–36.0)
MCV: 96.7 fL (ref 80.0–100.0)
MONO ABS: 1287 {cells}/uL — AB (ref 200–950)
MPV: 11.7 fL (ref 7.5–12.5)
Monocytes Relative: 11 %
NEUTROS ABS: 8775 {cells}/uL — AB (ref 1500–7800)
Neutrophils Relative %: 75 %
PLATELETS: 85 10*3/uL — AB (ref 140–400)
RBC: 4.58 MIL/uL (ref 3.80–5.10)
RDW: 14.2 % (ref 11.0–15.0)
WBC: 11.7 10*3/uL — ABNORMAL HIGH (ref 3.8–10.8)

## 2016-02-25 LAB — TSH: TSH: 0.31 m[IU]/L — AB

## 2016-02-26 ENCOUNTER — Other Ambulatory Visit: Payer: Self-pay | Admitting: Family Medicine

## 2016-02-26 DIAGNOSIS — E039 Hypothyroidism, unspecified: Secondary | ICD-10-CM

## 2016-02-26 LAB — COMPLETE METABOLIC PANEL WITH GFR
ALBUMIN: 4.9 g/dL (ref 3.6–5.1)
ALK PHOS: 86 U/L (ref 33–130)
ALT: 37 U/L — ABNORMAL HIGH (ref 6–29)
AST: 20 U/L (ref 10–35)
BUN: 24 mg/dL (ref 7–25)
CO2: 27 mmol/L (ref 20–31)
Calcium: 10.1 mg/dL (ref 8.6–10.4)
Chloride: 101 mmol/L (ref 98–110)
Creat: 1.07 mg/dL — ABNORMAL HIGH (ref 0.50–0.99)
GFR, EST NON AFRICAN AMERICAN: 54 mL/min — AB (ref >=60)
GFR, Est African American: 62 mL/min (ref >=60)
GLUCOSE: 89 mg/dL (ref 65–99)
POTASSIUM: 4.5 mmol/L (ref 3.5–5.3)
SODIUM: 140 mmol/L (ref 135–146)
Total Bilirubin: 0.3 mg/dL (ref 0.2–1.2)
Total Protein: 7.4 g/dL (ref 6.1–8.1)

## 2016-02-26 LAB — LIPID PANEL
CHOL/HDL RATIO: 3.3 ratio (ref <=5.0)
Cholesterol: 199 mg/dL (ref 125–200)
HDL: 61 mg/dL (ref >=46)
LDL CALC: 105 mg/dL (ref <130)
Triglycerides: 167 mg/dL — ABNORMAL HIGH (ref <150)
VLDL: 33 mg/dL — AB (ref <30)

## 2016-02-26 MED ORDER — LEVOTHYROXINE SODIUM 50 MCG PO TABS: 50.0000 ug | ORAL_TABLET | Freq: Every day | ORAL | 0 refills | Status: DC

## 2016-02-26 NOTE — Assessment & Plan Note (Signed)
Adjust dose; recheck TSH in 8w eeks 

## 2016-02-26 NOTE — Progress Notes (Signed)
Decrease thyroid med dose; recheck TSH in 8 weeks

## 2016-03-01 ENCOUNTER — Ambulatory Visit (INDEPENDENT_AMBULATORY_CARE_PROVIDER_SITE_OTHER): Payer: Medicare Other | Admitting: Family Medicine

## 2016-03-01 ENCOUNTER — Encounter: Payer: Self-pay | Admitting: Family Medicine

## 2016-03-01 DIAGNOSIS — B009 Herpesviral infection, unspecified: Secondary | ICD-10-CM | POA: Diagnosis not present

## 2016-03-01 DIAGNOSIS — E039 Hypothyroidism, unspecified: Secondary | ICD-10-CM

## 2016-03-01 DIAGNOSIS — H6123 Impacted cerumen, bilateral: Secondary | ICD-10-CM

## 2016-03-01 NOTE — Progress Notes (Signed)
BP 138/74   Pulse 87   Temp 98.1 F (36.7 C) (Oral)   Resp 16   Wt 124 lb (56.2 kg)   SpO2 95%   BMI 21.28 kg/m    Subjective:    Patient ID: Anna Lowe, female    DOB: 03-30-1946, 70 y.o.   MRN: [id number]  HPI: Anna Lowe is a 70 y.o. female  Chief Complaint  Patient presents with  . Ear Fullness   She got sick on a Saturday and went to urgent care She told the provider what she needed; she needed antibiotics, prednisone She says it has left her chest but now it's in the ears; nose is not stopped up; she is blowing stuff out; mucous is coming out; ears won't let loose; very muffled; crackling; no medicine for the ears; no nasal sprays No problems with the steroids  She had thyroid test done on Nov 2nd; dose adjusted, due for recheck 8 weeks later  Has an outbreak on her right cheek; first episode was about 37 years ago; breaks out every 4-5 years  Depression screen [facility] 2/9 02/05/2016 11/10/2015  Decreased Interest 0 0  Down, Depressed, Hopeless 0 0  PHQ - 2 Score 0 0   Relevant past medical, surgical, family and social history reviewed Past Medical History:  Diagnosis Date  . Abdominal pain   . Allergy   . Anxiety   . Arthritis    in hands  . Asthma   . Back pain   . Breast mass 08/27/2014   bilat 3:00 lumps  . Cancer ([place])    skin ca  . Cataract   . COPD (chronic obstructive pulmonary disease) ([place])   . Elevated alkaline phosphatase level   . Elevated serum glutamic pyruvic transaminase (SGPT) level   . GERD (gastroesophageal reflux disease)   . Hemorrhagic shock Aug 2015   s/p liver biopsy  . 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 History of hip fracture   . History of liver biopsy Aug 123456   with complications, acute hemorragic shock  . Hyperalbuminemia   . Hyperlipidemia   . Hypertension   . Hypokalemia   . Hypothyroid   . Insomnia 11/10/2015  . Irritable bowel syndrome 11/10/2015  . Osteoporosis   . Right hip pain   . Short  leg syndrome, acquired   . Thrombopenia [facility])    Social History  Substance Use Topics  . Smoking status: Never Smoker  . Smokeless tobacco: Never Used  . Alcohol use No   Interim medical history since last visit reviewed. Allergies and medications reviewed  Review of Systems Per HPI unless specifically indicated above     Objective:    BP 138/74   Pulse 87   Temp 98.1 F (36.7 C) (Oral)   Resp 16   Wt 124 lb (56.2 kg)   SpO2 95%   BMI 21.28 kg/m   Wt Readings from Last 3 Encounters:  03/01/16 124 lb (56.2 kg)  02/05/16 125 lb (56.7 kg)  11/10/15 124 lb (56.2 kg)    Physical Exam  Constitutional: She appears well-developed and well-nourished. No distress.  HENT:  Right Ear: Tympanic membrane is not injected. No middle ear effusion. Decreased hearing is noted.  Left Ear: Tympanic membrane is not injected.  No middle ear effusion. Decreased hearing is noted.  Mouth/Throat: Oropharynx is clear and moist.  Significant cerumen occluding both canals; one side removed with curette, the other with irrigation; good results  on both sides with restoration of hearing  Cardiovascular: Normal rate and regular rhythm.   Pulmonary/Chest: Effort normal and breath sounds normal. No respiratory distress. She has no wheezes.  Lymphadenopathy:    She has no cervical adenopathy.  Psychiatric: She has a normal mood and affect. Her behavior is normal.      Assessment & Plan:   Problem List Items Addressed This Visit      Endocrine   Adult hypothyroidism (Chronic)    Dose recently adjusted; rechecking tsh 8 weeks later; orders already in system        Nervous and Auditory   Cerumen impaction    Bilateral; external auditory canals cleaned with curette and irrigation with good results; patient tolerated well        Other   Herpes simplex infection    Explained recurrent, may break out at times of weakened immune response; contagious          Follow up plan: No Follow-up on  file.  An after-visit summary was printed and given to the patient at [place].  Please see the patient instructions which may contain other information and recommendations beyond what is mentioned above in the assessment and plan.  No orders of the defined types were placed in this encounter.   No orders of the defined types were placed in this encounter.

## 2016-03-01 NOTE — Patient Instructions (Signed)
Decrease thyroid medicine just a little bit Recheck the TSH in 6-8 weeks Don't use any products in the ear canals Don't use ear plugs

## 2016-03-09 ENCOUNTER — Encounter: Payer: Self-pay | Admitting: Family Medicine

## 2016-03-09 DIAGNOSIS — B009 Herpesviral infection, unspecified: Secondary | ICD-10-CM

## 2016-03-09 HISTORY — DX: Herpesviral infection, unspecified: B00.9

## 2016-03-09 NOTE — Assessment & Plan Note (Addendum)
Dose recently adjusted; rechecking tsh 8 weeks later; orders already in system

## 2016-03-09 NOTE — Assessment & Plan Note (Signed)
Explained recurrent, may break out at times of weakened immune response; contagious

## 2016-03-09 NOTE — Assessment & Plan Note (Signed)
Bilateral; external auditory canals cleaned with curette and irrigation with good results; patient tolerated well

## 2016-04-12 ENCOUNTER — Other Ambulatory Visit: Payer: Self-pay | Admitting: Family Medicine

## 2016-04-19 ENCOUNTER — Telehealth: Payer: Self-pay | Admitting: Family Medicine

## 2016-04-19 MED ORDER — AMLODIPINE BESYLATE 2.5 MG PO TABS: 2.5000 mg | ORAL_TABLET | Freq: Every day | ORAL | 11 refills | Status: DC

## 2016-04-19 NOTE — Telephone Encounter (Signed)
PT SAID THAT THE PHARM HAS SENT 2 REQUEST IN FOR HER BLOOD PRESSURE MEDCIATION WITH NO RESPONSE. THE PHARM GAVE HER A COUPLE OVER THE WEEKEND BUT SHE IS OUT. PHARM IS [facility] IN [place].

## 2016-04-19 NOTE — Telephone Encounter (Signed)
rx approved

## 2016-04-22 IMAGING — US US GALLBLADDER-BILIARY (RUQ)
1 series · 14 of 25 positions shown · non-contrast
Comparison: None.

CLINICAL DATA: Elevated liver enzymes

EXAM:
US ABDOMEN LIMITED - RIGHT UPPER QUADRANT

[Series 1: us gallbladder-biliary (ruq) · 0.35mm/px · 14 of 47 slices shown]
[im 1/47]
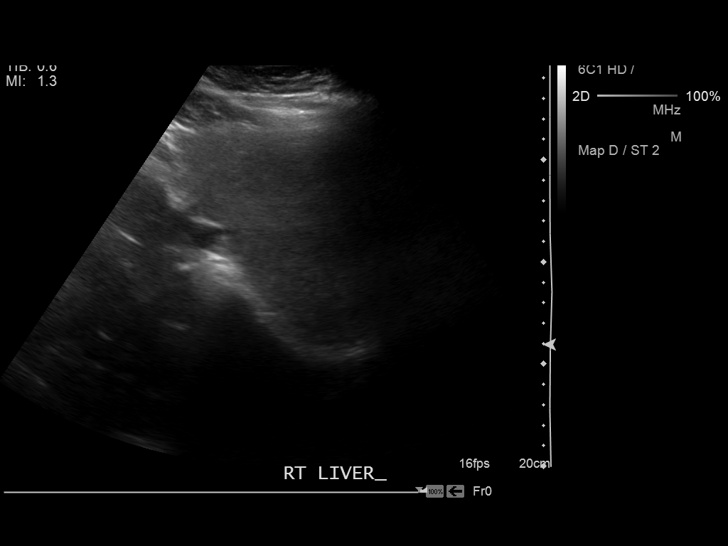
[im 4/47]
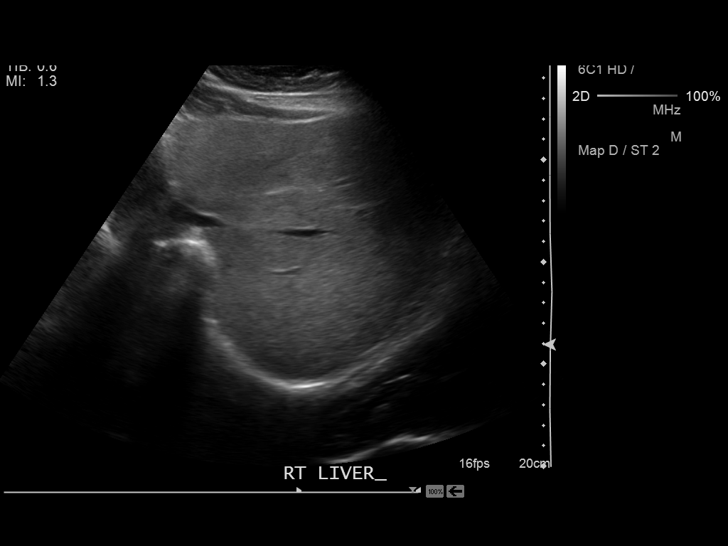
[im 8/47]
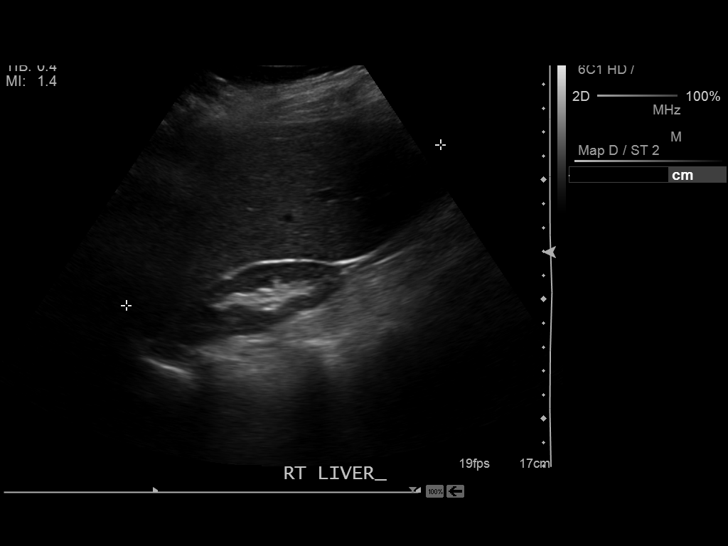
[im 12/47]
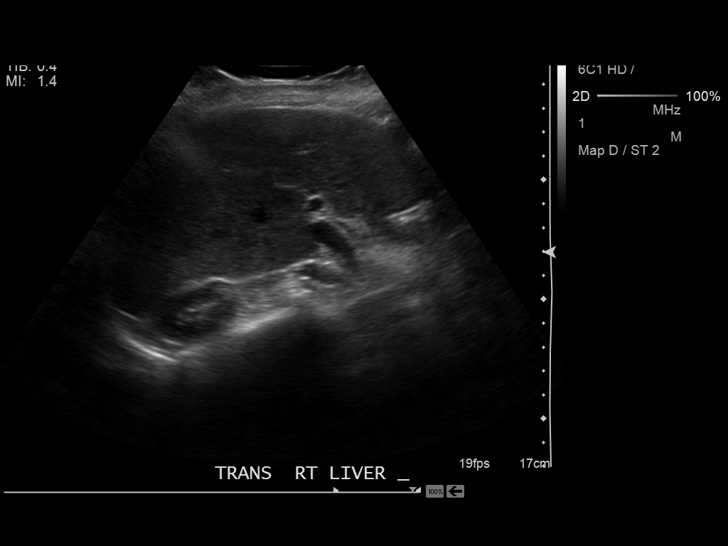
[im 16/47]
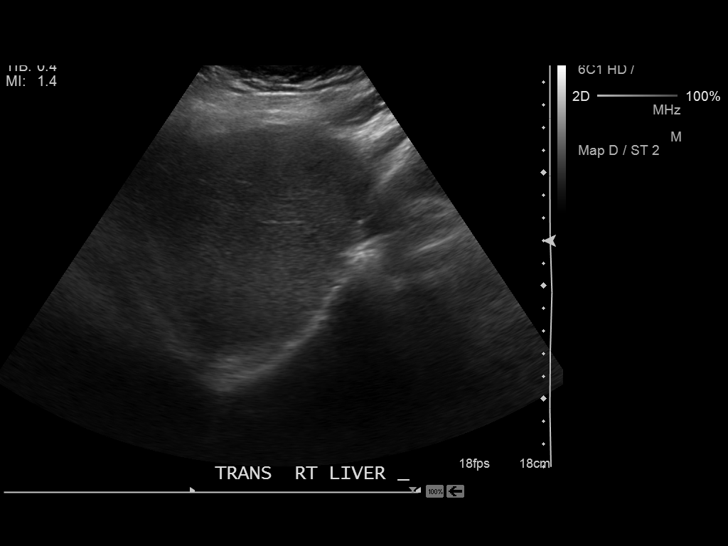
[im 18/47]
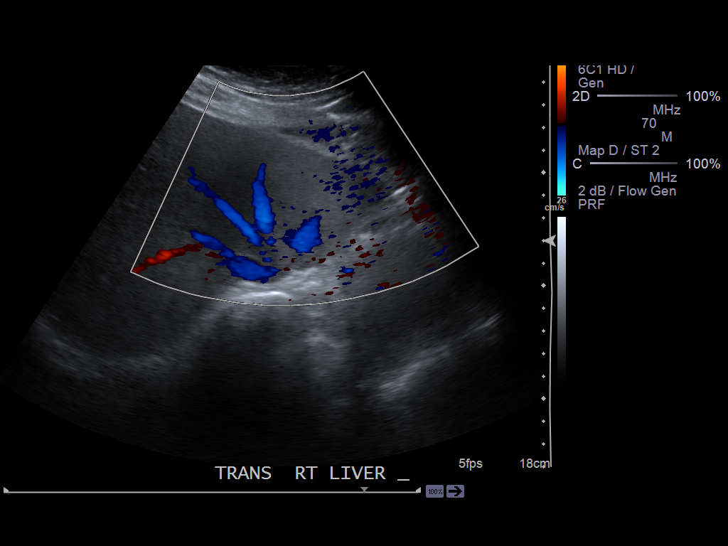
[im 22/47]
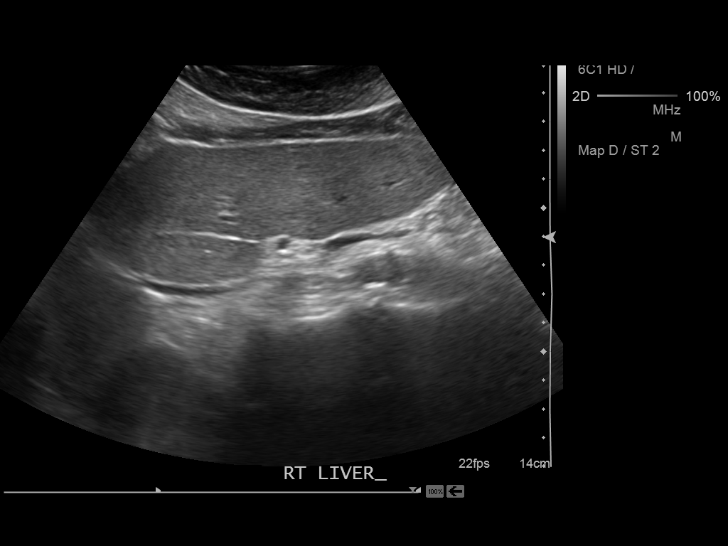
[im 25/47]
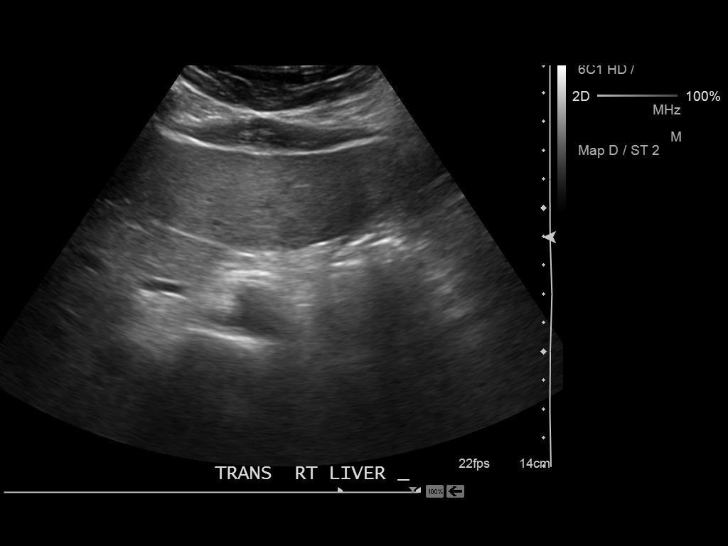
[im 29/47]
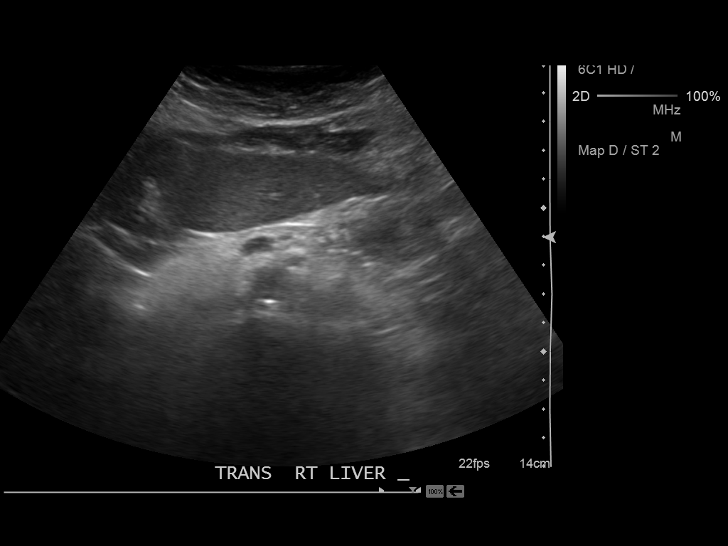
[im 31/47]
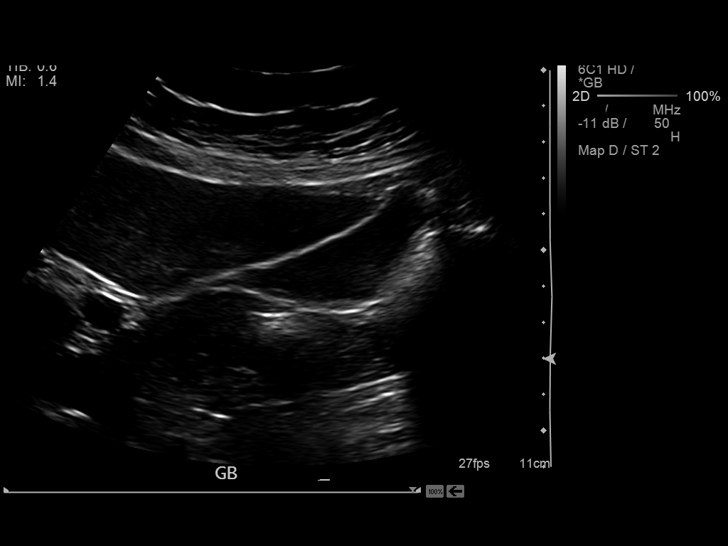
[im 35/47]
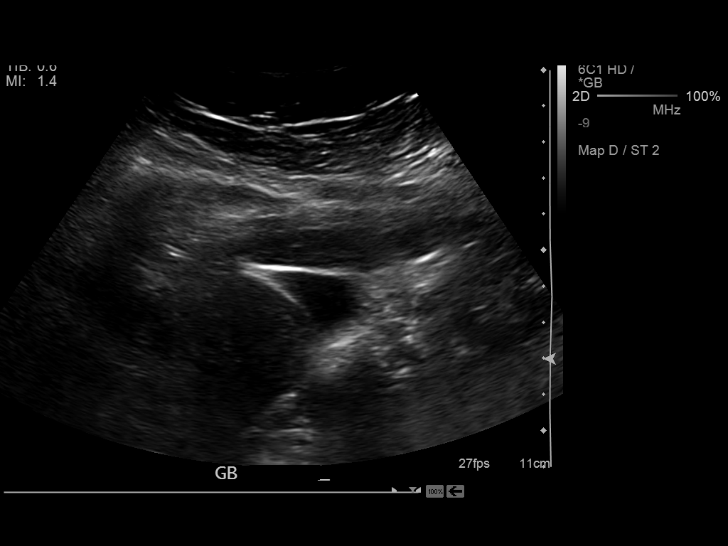
[im 39/47]
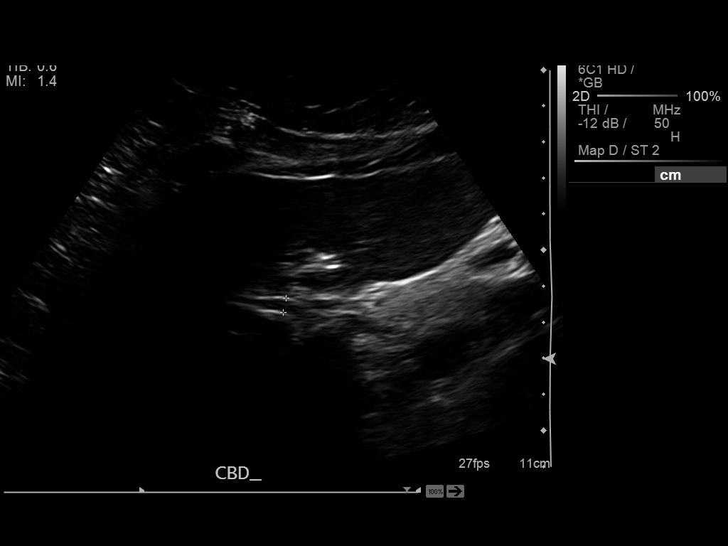
[im 43/47]
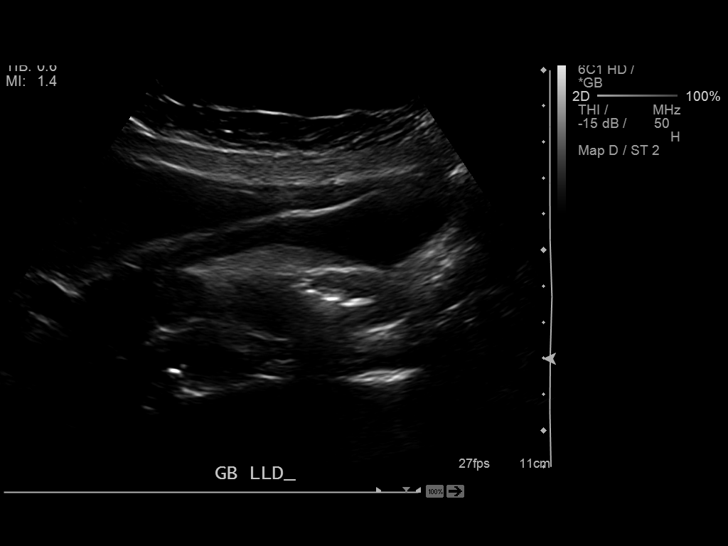
[im 47/47]
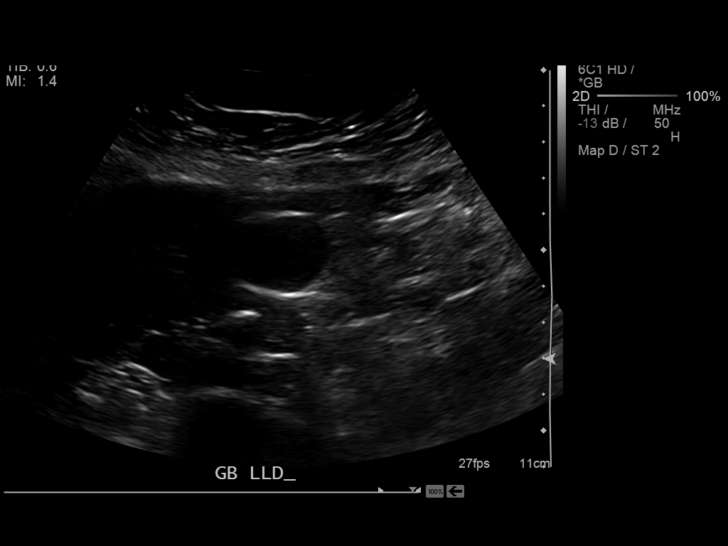

[14 of 25 positions shown; findings below may reference images not displayed]

FINDINGS: Gallbladder:

No gallstones or wall thickening visualized. No sonographic Murphy
sign noted.

Common bile duct:

Diameter: 4 mm

Liver:

No focal lesion identified. Within normal limits in parenchymal
echogenicity.
IMPRESSION: Normal study

## 2016-05-03 ENCOUNTER — Other Ambulatory Visit: Payer: Self-pay | Admitting: Family Medicine

## 2016-05-03 NOTE — Telephone Encounter (Signed)
Pt scheduled appointment for 05/17/16 she will be completely out of clonazepam before that date and is asking that you please give her enough to last until then.

## 2016-05-04 NOTE — Telephone Encounter (Signed)
Left detailed voicmail 

## 2016-05-04 NOTE — Telephone Encounter (Signed)
She had this filled on 04/12/16, so she should have enough until 05/12/16 I will refill this when I return next week ("remind me" note sent to appear on my desktop 05/11/16)

## 2016-05-12 ENCOUNTER — Telehealth: Payer: Self-pay | Admitting: Family Medicine

## 2016-05-12 MED ORDER — CLONAZEPAM 0.5 MG PO TABS: 0.5000 mg | ORAL_TABLET | Freq: Every evening | ORAL | 2 refills | Status: DC | PRN

## 2016-05-12 NOTE — Telephone Encounter (Signed)
[place] web site reviewed; no other prescribers, no red flags Rx phoned in to [facility]; I held for over 10 minutes to talk to pharmacist, had to hang up and leave voicemail; successfully left Rx via voicemail

## 2016-05-17 ENCOUNTER — Ambulatory Visit (INDEPENDENT_AMBULATORY_CARE_PROVIDER_SITE_OTHER): Payer: Medicare Other | Admitting: Family Medicine

## 2016-05-17 ENCOUNTER — Encounter: Payer: Self-pay | Admitting: Family Medicine

## 2016-05-17 DIAGNOSIS — E039 Hypothyroidism, unspecified: Secondary | ICD-10-CM | POA: Diagnosis not present

## 2016-05-17 DIAGNOSIS — F5101 Primary insomnia: Secondary | ICD-10-CM

## 2016-05-17 DIAGNOSIS — Z79899 Other long term (current) drug therapy: Secondary | ICD-10-CM

## 2016-05-17 NOTE — Patient Instructions (Signed)
Return in 3 months We'll get labs today Never ever mix the benzo (sleeping pill) with any alcohol or pain medicine (narcotics)

## 2016-05-17 NOTE — Progress Notes (Signed)
BP 116/74   Pulse 89   Temp 98.7 F (37.1 C) (Oral)   Resp 14   Wt 126 lb (57.2 kg)   SpO2 95%   BMI 21.63 kg/m    Subjective:    Patient ID: Anna Lowe, female    DOB: 11-10-1945, 71 y.o.   MRN: [id number]  HPI: Anna Lowe is a 71 y.o. female  Chief Complaint  Patient presents with  . Follow-up   She is here for medication follow-up She has chronic insomnia She takes benzodiazepine at night; she is not interested in tapering any further The medicine does not make her loopy or goofy or drunk She knows to not take the medicine with any alcohol [facility] web site reviewed; no red flags  Also needs thyroid checked She is losing her hair again; started several weeks ago, more hair on the pillow; loose stools, no weight loss, in fact one pound gain Sits in front of the tv and nodding off in the evening, then not sleepy at night Suspects thyroid is off  Depression screen [facility] 2/9 05/17/2016 02/05/2016 11/10/2015  Decreased Interest 0 0 0  Down, Depressed, Hopeless 0 0 0  PHQ - 2 Score 0 0 0   Relevant past medical, surgical, family and social history reviewed Past Medical History:  Diagnosis Date  . Abdominal pain   . Allergy   . Anxiety   . Arthritis    in hands  . Asthma   . Back pain   . Breast mass 08/27/2014   bilat 3:00 lumps  . Cancer ([place])    skin ca  . Cataract   . COPD (chronic obstructive pulmonary disease) ([place])   . Elevated alkaline phosphatase level   . Elevated serum glutamic pyruvic transaminase (SGPT) level   . GERD (gastroesophageal reflux disease)   . Hemorrhagic shock Aug 2015   s/p liver biopsy  . 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 History of hip fracture   . History of liver biopsy Aug 123456   with complications, acute hemorragic shock  . Hyperalbuminemia   . Hyperlipidemia   . Hypertension   . Hypokalemia   . Hypothyroid   . Insomnia 11/10/2015  . Irritable bowel syndrome 11/10/2015  . Osteoporosis   . Right hip  pain   . Short leg syndrome, acquired   . Thrombopenia [facility])    Past Surgical History:  Procedure Laterality Date  . APPENDECTOMY  1963  . CATARACT EXTRACTION  1989  . COLONOSCOPY WITH PROPOFOL N/A 09/25/2014   Procedure: COLONOSCOPY WITH PROPOFOL;  Surgeon: Josefine Class, MD;  Location: [facility] ENDOSCOPY;  Service: Endoscopy;  Laterality: N/A;    Social History  Substance Use Topics  . Smoking status: Never Smoker  . Smokeless tobacco: Never Used  . Alcohol use No    Interim medical history since last visit reviewed. Allergies and medications reviewed  Review of Systems Per HPI unless specifically indicated above     Objective:    BP 116/74   Pulse 89   Temp 98.7 F (37.1 C) (Oral)   Resp 14   Wt 126 lb (57.2 kg)   SpO2 95%   BMI 21.63 kg/m   Wt Readings from Last 3 Encounters:  05/17/16 126 lb (57.2 kg)  03/01/16 124 lb (56.2 kg)  02/05/16 125 lb (56.7 kg)    Physical Exam  Constitutional: She appears well-developed and well-nourished.  HENT:  Mouth/Throat: Mucous membranes are normal.  Eyes: EOM  are normal. No scleral icterus.  Neck: No thyroid mass and no thyromegaly present.  Cardiovascular: Normal rate and regular rhythm.   Pulmonary/Chest: Effort normal and breath sounds normal.  Skin:  Thinning hair on dome of scalp; no visible breakage  Psychiatric: She has a normal mood and affect. Her behavior is normal.      Assessment & Plan:   Problem List Items Addressed This Visit      Endocrine   Adult hypothyroidism (Chronic)    Check TSH, adjust dose if needed        Other   Insomnia (Chronic)    Chronic issue; patient did manage to wean down somewhat on her benzo, but we have not been able to go any lower and still address her symptoms; she does not wish to wean down any further; she feels the medicine benefits her and improves her quality of life; [place] web site viewed; no red flags; she is told again to never mix this medicine with other  sleeping pills, pain pills, especially narcotics, or anxiety medicine, or any alcohol; she agrees; I'll see her every three months and we'll readdress whether or not she would like to try and taper at next visit      Controlled substance agreement signed    [place] web site reviewed; Rx approved; no red flags         Follow up plan: No Follow-up on file.  An after-visit summary was printed and given to the patient at [place].  Please see the patient instructions which may contain other information and recommendations beyond what is mentioned above in the assessment and plan.  Meds ordered this encounter  Medications  . esomeprazole (NEXIUM) 40 MG capsule    Sig: TK 1 C PO QD    Refill:  1    No orders of the defined types were placed in this encounter.

## 2016-05-18 LAB — TSH: TSH: 9.01 m[IU]/L — AB

## 2016-05-18 NOTE — Assessment & Plan Note (Signed)
[place] web site reviewed; Rx approved; no red flags

## 2016-05-18 NOTE — Assessment & Plan Note (Signed)
Chronic issue; patient did manage to wean down somewhat on her benzo, but we have not been able to go any lower and still address her symptoms; she does not wish to wean down any further; she feels the medicine benefits her and improves her quality of life; [place] web site viewed; no red flags; she is told again to never mix this medicine with other sleeping pills, pain pills, especially narcotics, or anxiety medicine, or any alcohol; she agrees; I'll see her every three months and we'll readdress whether or not she would like to try and taper at next visit

## 2016-05-18 NOTE — Assessment & Plan Note (Signed)
Check TSH, adjust dose if needed

## 2016-05-20 ENCOUNTER — Other Ambulatory Visit: Payer: Self-pay | Admitting: Family Medicine

## 2016-05-20 DIAGNOSIS — E039 Hypothyroidism, unspecified: Secondary | ICD-10-CM

## 2016-05-20 MED ORDER — LEVOTHYROXINE SODIUM 75 MCG PO TABS: 75.0000 ug | ORAL_TABLET | Freq: Every day | ORAL | 0 refills | Status: DC

## 2016-05-20 NOTE — Progress Notes (Signed)
Check TSH in 6-8 weeks.

## 2016-05-20 NOTE — Assessment & Plan Note (Signed)
Check tsh in 6-8 weeks

## 2016-06-02 DIAGNOSIS — J439 Emphysema, unspecified: Secondary | ICD-10-CM | POA: Diagnosis not present

## 2016-06-02 DIAGNOSIS — R0781 Pleurodynia: Secondary | ICD-10-CM | POA: Diagnosis not present

## 2016-06-02 DIAGNOSIS — R0602 Shortness of breath: Secondary | ICD-10-CM | POA: Diagnosis not present

## 2016-06-13 DIAGNOSIS — R0609 Other forms of dyspnea: Secondary | ICD-10-CM | POA: Diagnosis not present

## 2016-06-13 DIAGNOSIS — R091 Pleurisy: Secondary | ICD-10-CM | POA: Diagnosis not present

## 2016-06-13 DIAGNOSIS — J439 Emphysema, unspecified: Secondary | ICD-10-CM | POA: Diagnosis not present

## 2016-08-08 ENCOUNTER — Encounter: Payer: Self-pay | Admitting: Family Medicine

## 2016-08-08 ENCOUNTER — Ambulatory Visit (INDEPENDENT_AMBULATORY_CARE_PROVIDER_SITE_OTHER): Payer: Medicare Other | Admitting: Family Medicine

## 2016-08-08 DIAGNOSIS — F064 Anxiety disorder due to known physiological condition: Secondary | ICD-10-CM | POA: Diagnosis not present

## 2016-08-08 DIAGNOSIS — F411 Generalized anxiety disorder: Secondary | ICD-10-CM | POA: Diagnosis not present

## 2016-08-08 DIAGNOSIS — E039 Hypothyroidism, unspecified: Secondary | ICD-10-CM

## 2016-08-08 DIAGNOSIS — K219 Gastro-esophageal reflux disease without esophagitis: Secondary | ICD-10-CM | POA: Diagnosis not present

## 2016-08-08 DIAGNOSIS — D696 Thrombocytopenia, unspecified: Secondary | ICD-10-CM

## 2016-08-08 DIAGNOSIS — F0631 Mood disorder due to known physiological condition with depressive features: Secondary | ICD-10-CM | POA: Diagnosis not present

## 2016-08-08 DIAGNOSIS — Z79899 Other long term (current) drug therapy: Secondary | ICD-10-CM | POA: Diagnosis not present

## 2016-08-08 DIAGNOSIS — I10 Essential (primary) hypertension: Secondary | ICD-10-CM

## 2016-08-08 DIAGNOSIS — F419 Anxiety disorder, unspecified: Secondary | ICD-10-CM | POA: Diagnosis not present

## 2016-08-08 DIAGNOSIS — G8929 Other chronic pain: Secondary | ICD-10-CM | POA: Diagnosis not present

## 2016-08-08 MED ORDER — CLONAZEPAM 0.5 MG PO TABS: 0.5000 mg | ORAL_TABLET | Freq: Every evening | ORAL | 2 refills | Status: DC | PRN

## 2016-08-08 NOTE — Progress Notes (Signed)
BP 132/84   Pulse 90   Temp 97.7 F (36.5 C) (Oral)   Resp 16   Wt 131 lb 12.8 oz (59.8 kg)   SpO2 93%   BMI 22.62 kg/m    Subjective:    Patient ID: Anna Lowe, female    DOB: [birth year], 71 y.o.   MRN: [id number]  HPI: Anna Lowe is a 71 y.o. female  Chief Complaint  Patient presents with  . Medication Refill  . Edema    bilateral legs and ankles    HPI Here for three month follow-up for clonazepam Weaned down on her own to  She might try to wean a little further Knows to not mix with alcohol She has been on the medicine so long; if she doesn't take it at night, she doesn't sleep No harms  BP is controlled on low dose amlodipine  She had just come back from a cruise; fet and lower legs are affected No SHOB Neither leg is red or warm; no pain in the back of the calf  She had pleurisy the whole month of February; Dr. 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 Abdominal pain   . Allergy   . Anxiety   . Arthritis    in hands  . Asthma   . Back pain   . Breast mass 08/27/2014   bilat 3:00 lumps  . Cancer ([place])    skin ca  . Cataract   . COPD (chronic obstructive pulmonary disease)  ([place])   . Elevated alkaline phosphatase level   . Elevated serum glutamic pyruvic transaminase (SGPT) level   . GERD (gastroesophageal reflux disease)   . Hemorrhagic shock [facility]) Aug 2015   s/p liver biopsy  . 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 History of hip fracture   . History of liver biopsy Aug 1884   with complications, acute hemorragic shock  . Hyperalbuminemia   . Hyperlipidemia   . Hypertension   . Hypokalemia   . Hypothyroid   . Insomnia 11/10/2015  . Irritable bowel syndrome 11/10/2015  . Osteoporosis   . Pleurisy   . Right hip pain   . Short leg syndrome, acquired   . Thrombopenia [facility])    Past Surgical History:  Procedure Laterality Date  . APPENDECTOMY  1963  . CATARACT EXTRACTION  1989  . COLONOSCOPY WITH PROPOFOL N/A 09/25/2014   Procedure: COLONOSCOPY WITH PROPOFOL;  Surgeon: Josefine Class, MD;  Location: [facility] ENDOSCOPY;  Service: Endoscopy;  Laterality: N/A;   Family History  Problem Relation Age of Onset  . Cancer Mother     skin  . Hyperlipidemia Mother   . Hypertension Mother   .  Heart disease Mother   . Emphysema Father   . Lung disease Father   . Stroke Maternal Grandmother   . Diabetes Neg Hx   . COPD Neg Hx   . Hematuria Neg Hx   . Breast cancer Neg Hx    Social History  Substance Use Topics  . Smoking status: Never Smoker  . Smokeless tobacco: Never Used  . Alcohol use No    Interim medical history since last visit reviewed. Allergies and medications reviewed  Review of Systems Per HPI unless specifically indicated above     Objective:    BP 132/84   Pulse 90   Temp 97.7 F (36.5 C) (Oral)   Resp 16   Wt 131 lb 12.8 oz (59.8 kg)   SpO2 93%   BMI 22.62 kg/m   Wt Readings from Last 3 Encounters:  08/08/16 131 lb 12.8 oz (59.8 kg)  05/17/16 126 lb (57.2 kg)  03/01/16 124 lb (56.2 kg)    Physical Exam  Constitutional: She appears well-developed and well-nourished.  HENT:  Mouth/Throat: Mucous membranes  are normal.  Eyes: EOM are normal. No scleral icterus.  Neck: No thyroid mass and no thyromegaly present.  Cardiovascular: Normal rate and regular rhythm.   Pulmonary/Chest: Effort normal and breath sounds normal.  Abdominal: She exhibits no distension.  Musculoskeletal: She exhibits edema (trace bilateral edema).  Unable to elicit a homan's sign bilaterall; legs are symmetric; no erythema, no increased warmth  Neurological: She is alert.  Skin: No ecchymosis noted. No pallor.  Thinning hair on dome of scalp; no visible breakage  Psychiatric: She has a normal mood and affect. Her behavior is normal.    Results for orders placed or performed in visit on 08/08/16  TSH  Result Value Ref Range   TSH 2.65 mIU/L      Assessment & Plan:   Problem List Items Addressed This Visit      Cardiovascular and Mediastinum   Hypertension (Chronic)    Controlled on low dose CCB        Digestive   GERD (gastroesophageal reflux disease) (Chronic)    I do not prescribe the PPI; she is apparently getting that from a doctor in [place]; I reminded her of the dangers of that medicine long-term, including increased risk of pneumonia, C diff colitis, osteoporosis, anemia, and reports of elevated liver enzymes, kidney damage, and heart disease        Endocrine   Adult hypothyroidism (Chronic)    Check TSH and adjust medicine if needed        Other   Thrombocytopenia (HCC) (Chronic)    Stable, no bleeding; chronic issue      Generalized anxiety disorder    Continue with benzo prescription as before, but encouraged patient to wean even further; she agrees to try to do so; discussed danger of benzo plus narcotic use, and gave her a copy of the Dec 24, 2014 FDA press release citing possible overdose if benzo plus narcotic used      Controlled substance agreement signed    Controlled substance agreement signed today; UDS collected per protocol; [place] web site reviewed, no red flags, no early  fills, no other prescribers          Follow up plan: Return in about 3 months (around 11/07/2016) for follow-up.  An after-visit summary was printed and given to the patient at [place].  Please see the patient instructions which may contain other information and recommendations beyond what is  mentioned above in the assessment and plan.  Meds ordered this encounter  Medications  . clonazePAM (KLONOPIN) 0.5 MG tablet    Sig: Take 1 tablet (0.5 mg total) by mouth at bedtime as needed.    Dispense:  30 tablet    Refill:  2    [facility]    No orders of the defined types were placed in this encounter.

## 2016-08-08 NOTE — Patient Instructions (Signed)
Compression stockings on both legs recommended now until swelling resolves I'll recommend you wear the stockings to and from cruises in the future on the planes, long car trips Return in 3 months

## 2016-08-09 ENCOUNTER — Other Ambulatory Visit: Payer: Self-pay | Admitting: Family Medicine

## 2016-08-09 LAB — TSH: TSH: 2.65 m[IU]/L

## 2016-08-09 MED ORDER — LEVOTHYROXINE SODIUM 75 MCG PO TABS: 75.0000 ug | ORAL_TABLET | Freq: Every day | ORAL | 3 refills | Status: AC

## 2016-08-09 NOTE — Assessment & Plan Note (Signed)
Stable, no bleeding; chronic issue

## 2016-08-09 NOTE — Assessment & Plan Note (Signed)
Continue with benzo prescription as before, but encouraged patient to wean even further; she agrees to try to do so; discussed danger of benzo plus narcotic use, and gave her a copy of the Dec 24, 2014 FDA press release citing possible overdose if benzo plus narcotic used

## 2016-08-09 NOTE — Progress Notes (Signed)
Normal TSH One year of thyroid med sent

## 2016-08-09 NOTE — Assessment & Plan Note (Signed)
Controlled on low dose CCB

## 2016-08-09 NOTE — Assessment & Plan Note (Signed)
Check TSH and adjust medicine if needed 

## 2016-08-09 NOTE — Assessment & Plan Note (Signed)
I do not prescribe the PPI; she is apparently getting that from a doctor in [place]; I reminded her of the dangers of that medicine long-term, including increased risk of pneumonia, C diff colitis, osteoporosis, anemia, and reports of elevated liver enzymes, kidney damage, and heart disease

## 2016-08-09 NOTE — Assessment & Plan Note (Signed)
Controlled substance agreement signed today; UDS collected per protocol; [place] web site reviewed, no red flags, no early fills, no other prescribers

## 2016-08-15 ENCOUNTER — Encounter: Payer: Self-pay | Admitting: Family Medicine

## 2016-08-18 ENCOUNTER — Encounter: Payer: Self-pay | Admitting: Family Medicine

## 2016-08-18 ENCOUNTER — Ambulatory Visit (INDEPENDENT_AMBULATORY_CARE_PROVIDER_SITE_OTHER): Payer: Medicare Other | Admitting: Family Medicine

## 2016-08-18 VITALS — BP 122/76 | HR 93 | Temp 97.8°F | Resp 16 | Wt 128.9 lb

## 2016-08-18 DIAGNOSIS — J441 Chronic obstructive pulmonary disease with (acute) exacerbation: Secondary | ICD-10-CM

## 2016-08-18 DIAGNOSIS — I6529 Occlusion and stenosis of unspecified carotid artery: Secondary | ICD-10-CM | POA: Diagnosis not present

## 2016-08-18 MED ORDER — PREDNISONE 10 MG PO TABS: ORAL_TABLET | ORAL | 0 refills | Status: AC

## 2016-08-18 MED ORDER — AZITHROMYCIN 250 MG PO TABS: ORAL_TABLET | ORAL | 0 refills | Status: AC

## 2016-08-18 NOTE — Progress Notes (Signed)
BP 122/76 (BP Location: Left Arm, Patient Position: Sitting, Cuff Size: Normal)   Pulse 93   Temp 97.8 F (36.6 C) (Oral)   Resp 16   Wt 128 lb 14.4 oz (58.5 kg)   SpO2 95%   BMI 22.13 kg/m    Subjective:    Patient ID: Anna Lowe, female    DOB: 09-Sep-1945, 71 y.o.   MRN: [id number]  HPI: Anna Lowe is a 71 y.o. female  Chief Complaint  Patient presents with  . Cough    Real bad cough     HPI Croupy cough Nose was running, eyes were running Thought it was allergies at first Now in the chest Had an appt with pullm on Thursday, but he canceled Up for 3 nights Wheezing a lot SHOB, "oh yeah" Woke up coughing for 30 minutes the last few nights Bringing up mucous, yellow and green, "normal" No rash No sore throat No ear problems Started with sinus congestion, started in the head; right side worse Similar symptoms, treated with ABX azithromycin and prednisone, 6 day taper of prednisone No recent ulcer; no diabetes  Depression screen [facility] 2/9 08/18/2016 08/08/2016 05/17/2016 02/05/2016 11/10/2015  Decreased Interest 0 0 0 0 0  Down, Depressed, Hopeless 0 0 0 0 0  PHQ - 2 Score 0 0 0 0 0    Relevant past medical, surgical, family and social history reviewed Past Medical History:  Diagnosis Date  . Abdominal pain   . Allergy   . Anxiety   . Arthritis    in hands  . Asthma   . Back pain   . Breast mass 08/27/2014   bilat 3:00 lumps  . Cancer ([place])    skin ca  . Cataract   . COPD (chronic obstructive pulmonary disease) ([place])   . Elevated alkaline phosphatase level   . Elevated serum glutamic pyruvic transaminase (SGPT) level   . GERD (gastroesophageal reflux disease)   . Hemorrhagic shock [facility]) Aug 2015   s/p liver biopsy  . 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 History of hip fracture   . History of liver biopsy Aug 1594   with complications, acute hemorragic shock  . Hyperalbuminemia   . Hyperlipidemia   . Hypertension   . Hypokalemia    . Hypothyroid   . Insomnia 11/10/2015  . Irritable bowel syndrome 11/10/2015  . Osteoporosis   . Pleurisy   . Right hip pain   . Short leg syndrome, acquired   . Thrombopenia [facility])    Past Surgical History:  Procedure Laterality Date  . APPENDECTOMY  1963  . CATARACT EXTRACTION  1989  . COLONOSCOPY WITH PROPOFOL N/A 09/25/2014   Procedure: COLONOSCOPY WITH PROPOFOL;  Surgeon: Josefine Class, MD;  Location: [facility] ENDOSCOPY;  Service: Endoscopy;  Laterality: N/A;   Family History  Problem Relation Age of Onset  . Cancer Mother     skin  . Hyperlipidemia Mother   . Hypertension Mother   . Heart disease Mother   . Emphysema Father   . Lung disease Father   . Stroke Maternal Grandmother   . Diabetes Neg Hx   . COPD Neg Hx   . Hematuria Neg Hx   . Breast cancer Neg Hx    Social History  Substance Use Topics  . Smoking status: Never Smoker  . Smokeless tobacco: Never Used  . Alcohol use No    Interim medical history since last visit reviewed. Allergies and medications reviewed  Review of Systems Per HPI unless specifically indicated above     Objective:    BP 122/76 (BP Location: Left Arm, Patient Position: Sitting, Cuff Size: Normal)   Pulse 93   Temp 97.8 F (36.6 C) (Oral)   Resp 16   Wt 128 lb 14.4 oz (58.5 kg)   SpO2 95%   BMI 22.13 kg/m   Wt Readings from Last 3 Encounters:  08/18/16 128 lb 14.4 oz (58.5 kg)  08/08/16 131 lb 12.8 oz (59.8 kg)  05/17/16 126 lb (57.2 kg)    Physical Exam  Constitutional: She appears well-developed and well-nourished.  HENT:  Mouth/Throat: Mucous membranes are normal.  Eyes: EOM are normal. No scleral icterus.  Cardiovascular: Normal rate and regular rhythm.   Pulmonary/Chest: Effort normal and breath sounds normal. She has no wheezes.  Psychiatric: She has a normal mood and affect. Her behavior is normal.    Results for orders placed or performed in visit on 08/08/16  TSH  Result Value Ref Range   TSH 2.65  mIU/L      Assessment & Plan:   Problem List Items Addressed This Visit      Respiratory   COPD with acute exacerbation ([place]) - Primary    Will treat with prednisone, azithromycin; rest, hydration, reasons to call reviewed       Relevant Medications   azithromycin (ZITHROMAX) 250 MG tablet   predniSONE (DELTASONE) 10 MG tablet    Other Visit Diagnoses    Carotid atherosclerosis, unspecified laterality       Relevant Orders   US Carotid Bilateral       Follow up plan: No Follow-up on file.  An after-visit summary was printed and given to the patient at [place].  Please see the patient instructions which may contain other information and recommendations beyond what is mentioned above in the assessment and plan.  Meds ordered this encounter  Medications  . azithromycin (ZITHROMAX) 250 MG tablet    Sig: Two by mouth today and one daily for 4 more days    Dispense:  6 tablet    Refill:  0  . predniSONE (DELTASONE) 10 MG tablet    Sig: Take 6 pills by mouth on day 1, 5 pills on day 2, 4 pills on day 3, 3 pills on day 4, 2 pills on day 5, 1 pill on day 6    Dispense:  21 tablet    Refill:  0    Orders Placed This Encounter  Procedures  . US Carotid Bilateral

## 2016-08-18 NOTE — Patient Instructions (Addendum)
Start the prednisone and antibiotics Generic mucinex is great Rest and hydration Please do eat yogurt daily or take a probiotic daily for the next month or two We want to replace the healthy germs in the gut If you notice foul, watery diarrhea in the next two months, schedule an appointment RIGHT AWAY Request records from your vascular doctor

## 2016-08-22 DIAGNOSIS — J441 Chronic obstructive pulmonary disease with (acute) exacerbation: Secondary | ICD-10-CM | POA: Insufficient documentation

## 2016-08-22 NOTE — Assessment & Plan Note (Signed)
Will treat with prednisone, azithromycin; rest, hydration, reasons to call reviewed

## 2016-08-29 DIAGNOSIS — J439 Emphysema, unspecified: Secondary | ICD-10-CM | POA: Diagnosis not present

## 2016-08-29 DIAGNOSIS — R05 Cough: Secondary | ICD-10-CM | POA: Diagnosis not present

## 2016-08-29 DIAGNOSIS — J310 Chronic rhinitis: Secondary | ICD-10-CM | POA: Diagnosis not present

## 2016-08-31 ENCOUNTER — Telehealth: Payer: Self-pay | Admitting: Family Medicine

## 2016-08-31 NOTE — Telephone Encounter (Signed)
Please follow-up on patient's carotid US that was ordered Thank you

## 2016-09-01 NOTE — Telephone Encounter (Signed)
Patient notified and scheduled for may 16 @ 1:30 [place] location

## 2016-09-07 ENCOUNTER — Ambulatory Visit: Payer: Medicare Other

## 2016-10-04 DIAGNOSIS — J439 Emphysema, unspecified: Secondary | ICD-10-CM | POA: Diagnosis not present

## 2016-10-16 IMAGING — US US PELVIS COMPLETE
1 series · 14 of 25 positions shown · non-contrast
Comparison: None

CLINICAL DATA: Left-sided pelvic pain and pressure for 2 weeks.
Postmenopausal female.

EXAM:
TRANSABDOMINAL AND TRANSVAGINAL ULTRASOUND OF PELVIS
TECHNIQUE: Both transabdominal and transvaginal ultrasound examinations of the
pelvis were performed. Transabdominal technique was performed for
global imaging of the pelvis including uterus, ovaries, adnexal
regions, and pelvic cul-de-sac. It was necessary to proceed with
endovaginal exam following the transabdominal exam to visualize the
endometrium and ovaries.

[Series 1: us pelvis complete · 0.18mm/px · 14 of 62 slices shown]
[im 1/62]
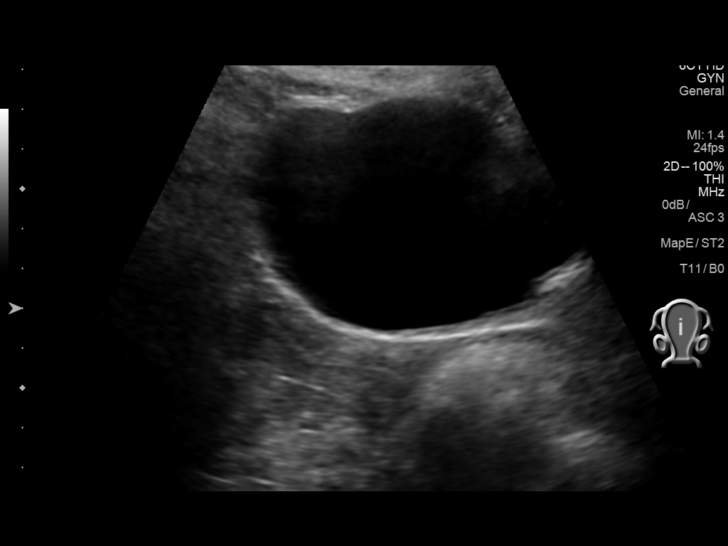
[im 6/62]
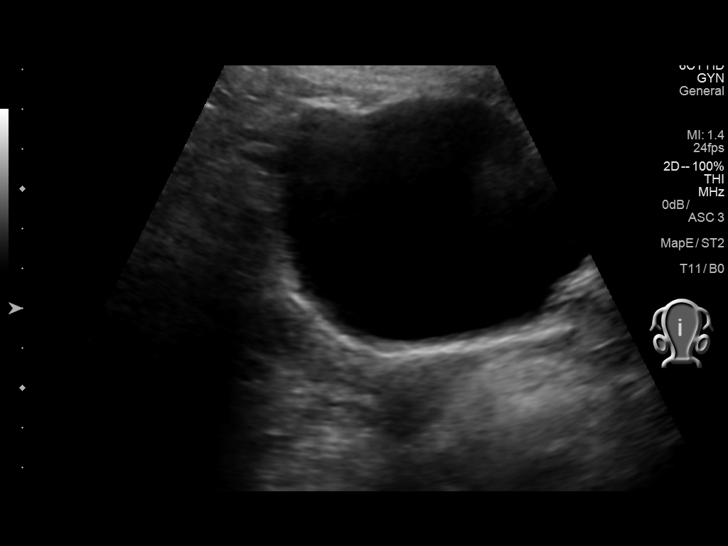
[im 11/62]
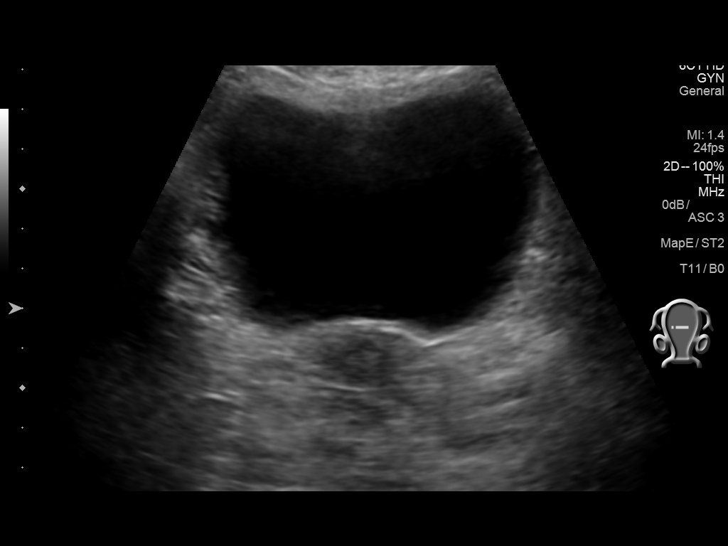
[im 16/62]
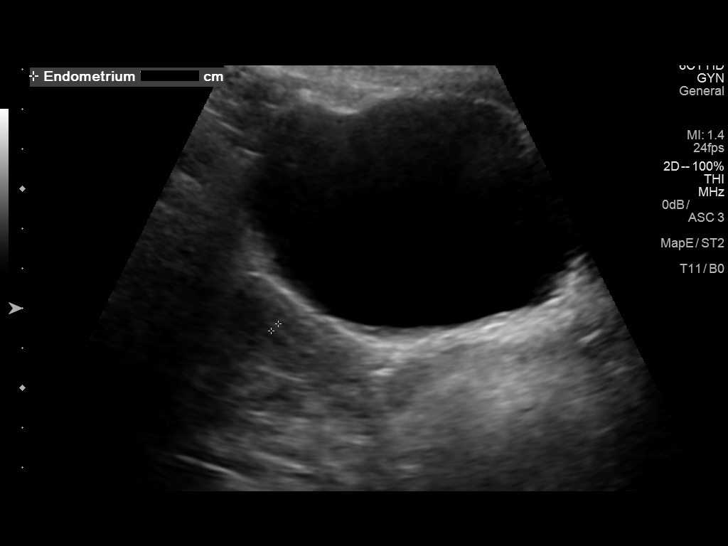
[im 21/62]
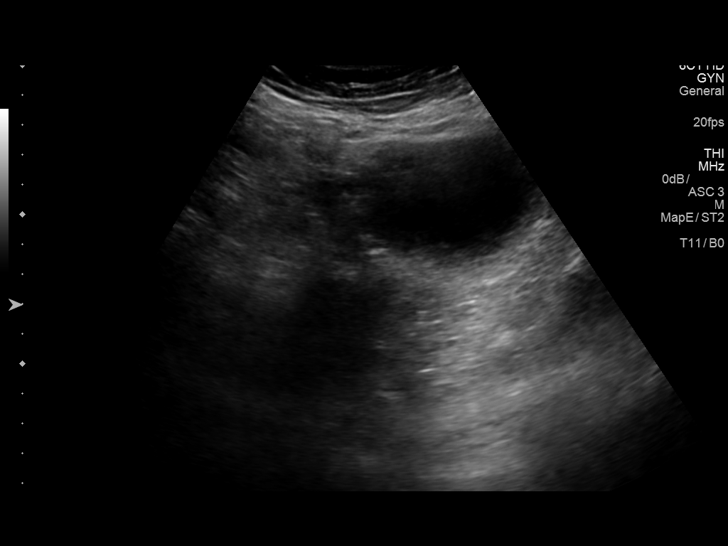
[im 23/62]
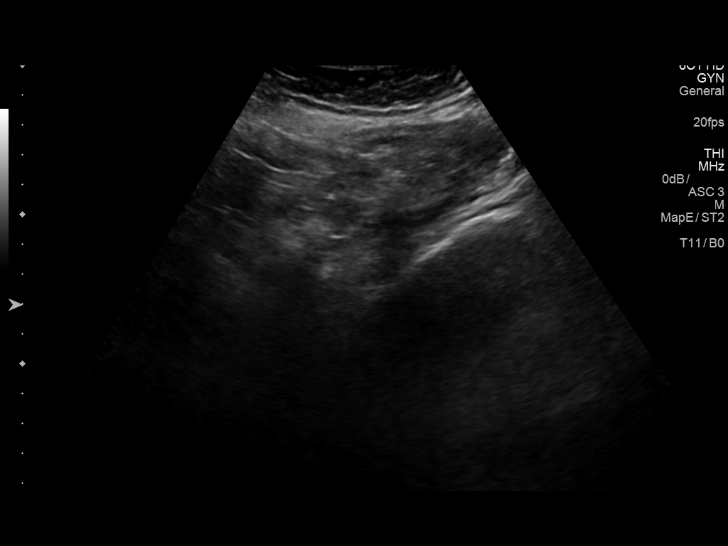
[im 28/62]
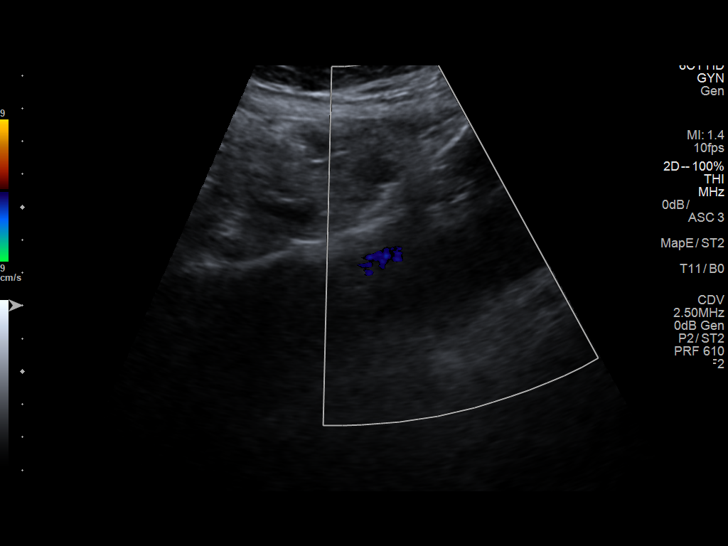
[im 34/62]
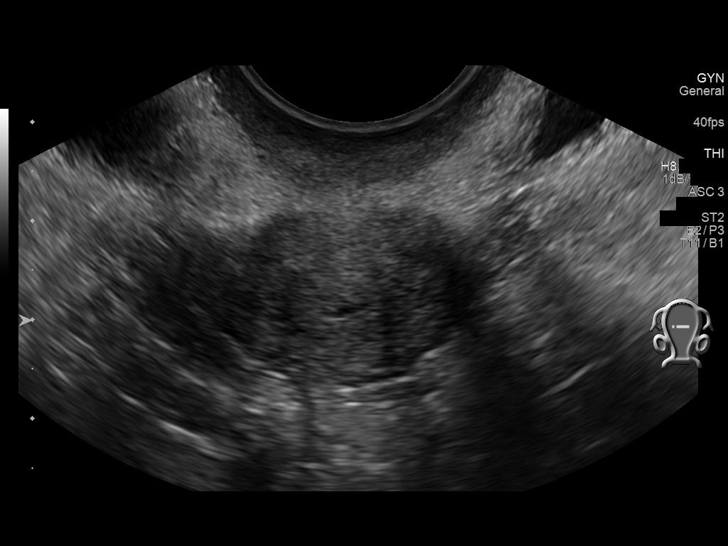
[im 39/62]
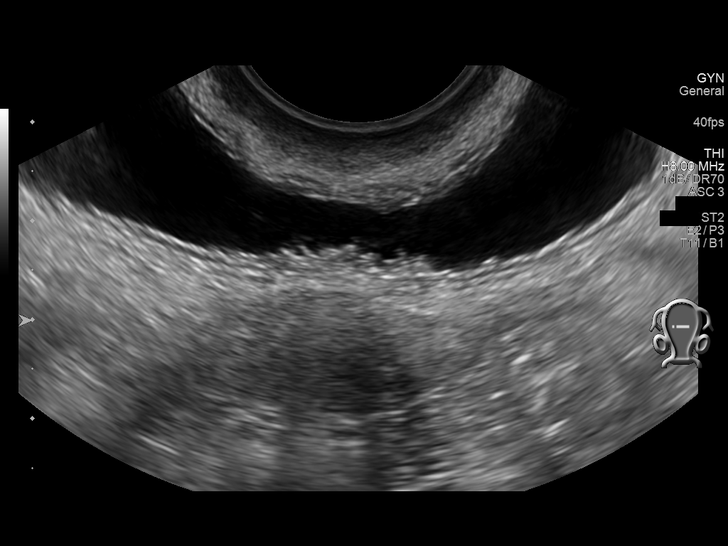
[im 41/62]
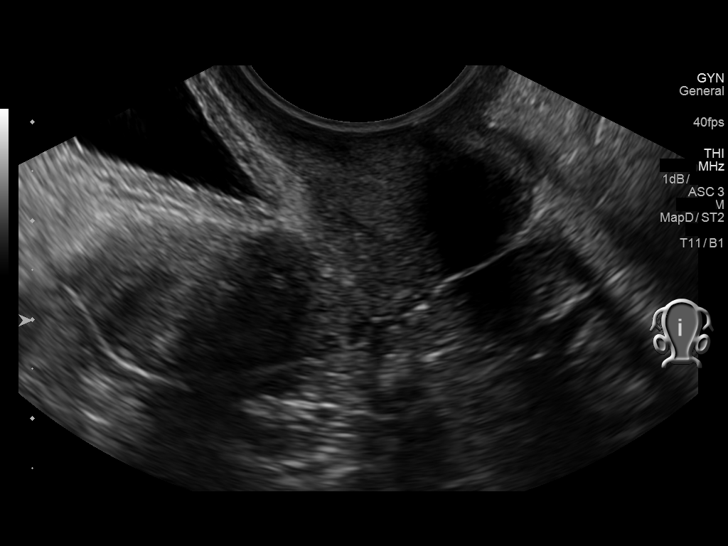
[im 46/62]
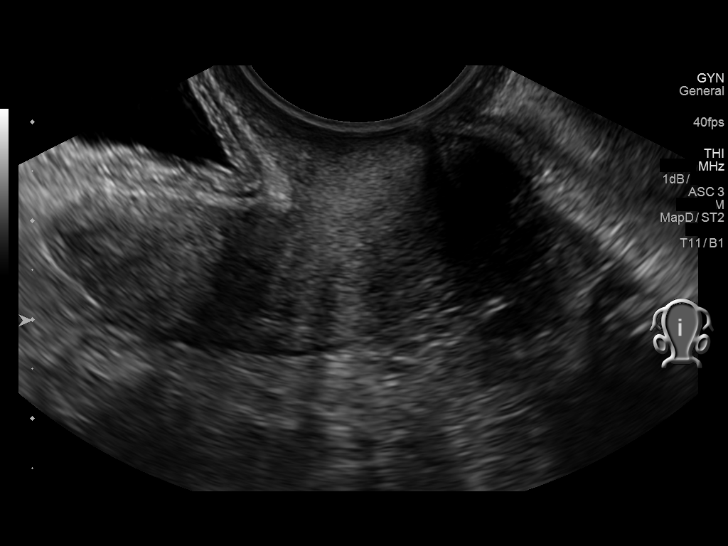
[im 51/62]
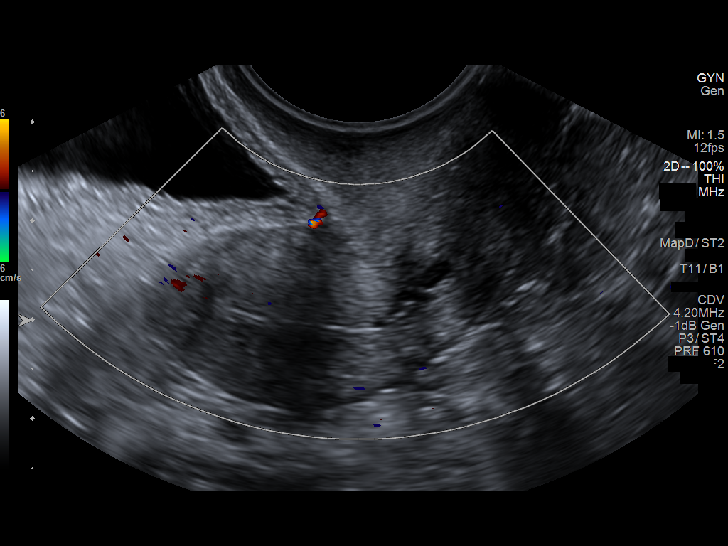
[im 56/62]
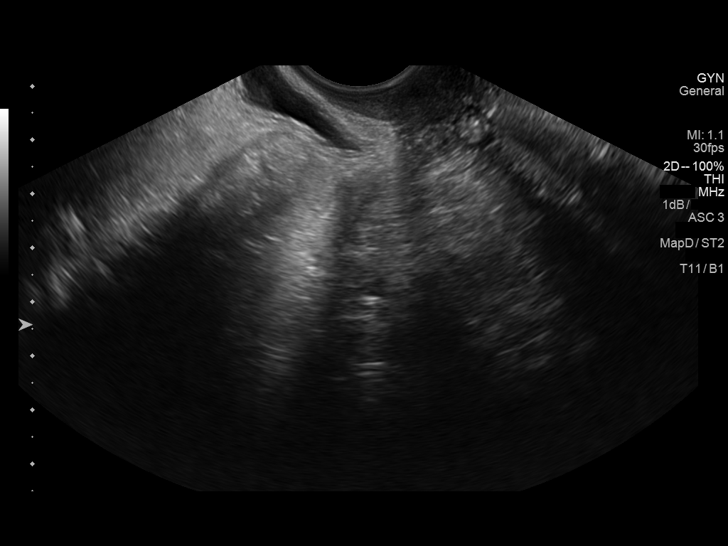
[im 62/62]
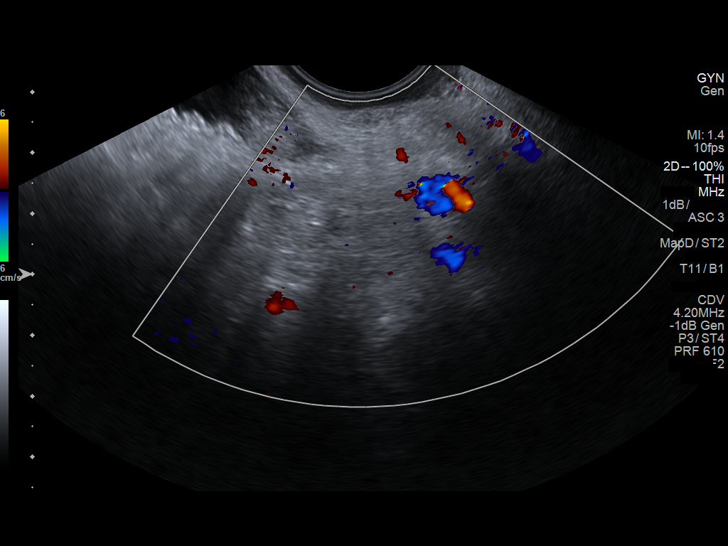

[14 of 25 positions shown; findings below may reference images not displayed]

FINDINGS: Uterus

Measurements: 5.1 x 1.8 x 2.8 cm. No fibroids or other mass
visualized.

Endometrium

Thickness: 3 mm.  No focal abnormality visualized.

Right ovary

Measurements: Not directly visualized, however no adnexal mass
identified.

Left ovary

Measurements: Not directly visualized, however no adnexal mass
identified.

Other findings

No abnormal free fluid.
IMPRESSION: Normal appearance of uterus.

Nonvisualization of ovaries, however no adnexal mass identified.

## 2016-10-20 ENCOUNTER — Other Ambulatory Visit: Payer: Self-pay | Admitting: Specialist

## 2016-10-20 DIAGNOSIS — I6529 Occlusion and stenosis of unspecified carotid artery: Secondary | ICD-10-CM

## 2016-10-28 ENCOUNTER — Ambulatory Visit
Admission: RE | Admit: 2016-10-28 | Discharge: 2016-10-28 | Disposition: A | Discharge status: Home / Self Care | Payer: Medicare Other | Source: Ambulatory Visit | Attending: Specialist | Admitting: Specialist

## 2016-10-28 DIAGNOSIS — I6522 Occlusion and stenosis of left carotid artery: Secondary | ICD-10-CM | POA: Diagnosis not present

## 2016-10-28 DIAGNOSIS — I6529 Occlusion and stenosis of unspecified carotid artery: Secondary | ICD-10-CM

## 2016-10-28 DIAGNOSIS — I6523 Occlusion and stenosis of bilateral carotid arteries: Secondary | ICD-10-CM | POA: Diagnosis not present

## 2016-11-03 ENCOUNTER — Ambulatory Visit (INDEPENDENT_AMBULATORY_CARE_PROVIDER_SITE_OTHER): Payer: Medicare Other | Admitting: Family Medicine

## 2016-11-03 ENCOUNTER — Encounter: Payer: Self-pay | Admitting: Family Medicine

## 2016-11-03 DIAGNOSIS — I6529 Occlusion and stenosis of unspecified carotid artery: Secondary | ICD-10-CM

## 2016-11-03 DIAGNOSIS — I6522 Occlusion and stenosis of left carotid artery: Secondary | ICD-10-CM

## 2016-11-03 DIAGNOSIS — R740 Nonspecific elevation of levels of transaminase and lactic acid dehydrogenase [LDH]: Secondary | ICD-10-CM

## 2016-11-03 DIAGNOSIS — E782 Mixed hyperlipidemia: Secondary | ICD-10-CM | POA: Diagnosis not present

## 2016-11-03 DIAGNOSIS — D696 Thrombocytopenia, unspecified: Secondary | ICD-10-CM | POA: Diagnosis not present

## 2016-11-03 DIAGNOSIS — Z79899 Other long term (current) drug therapy: Secondary | ICD-10-CM

## 2016-11-03 DIAGNOSIS — R7401 Elevation of levels of liver transaminase levels: Secondary | ICD-10-CM

## 2016-11-03 HISTORY — DX: Occlusion and stenosis of unspecified carotid artery: I65.29

## 2016-11-03 MED ORDER — CLONAZEPAM 0.5 MG PO TABS: 0.5000 mg | ORAL_TABLET | Freq: Every evening | ORAL | 2 refills | Status: AC | PRN

## 2016-11-03 MED ORDER — ATORVASTATIN CALCIUM 20 MG PO TABS: 20.0000 mg | ORAL_TABLET | Freq: Every day | ORAL | 1 refills | Status: AC

## 2016-11-03 MED ORDER — AMLODIPINE BESYLATE 5 MG PO TABS: 5.0000 mg | ORAL_TABLET | Freq: Every day | ORAL | 3 refills | Status: DC

## 2016-11-03 NOTE — Assessment & Plan Note (Signed)
New goal LDL less than 70; start statin, recheck in 6 weeks

## 2016-11-03 NOTE — Progress Notes (Signed)
BP 136/74 (BP Location: Left Arm, Patient Position: Sitting, Cuff Size: Normal)   Pulse 91   Temp 98.2 F (36.8 C) (Oral)   Resp 16   Ht 5\' 4"  (1.626 m)   Wt 126 lb (57.2 kg)   SpO2 93%   BMI 21.63 kg/m    Subjective:    Patient ID: Anna Lowe, female    DOB: [birth year], 71 y.o.   MRN: [id number]  HPI: Anna Lowe is a 71 y.o. female  Chief Complaint  Patient presents with  . Follow-up    HPI She is here for f/u  She had the scan of her neck scheduled, the pulmonologist ordered the scan, and every thing is fine Reviewed, carotid US 7/6/218  IMPRESSION: 1. Moderate amount of left-sided atherosclerotic plaque, not resulting in a hemodynamically significant stenosis. 2. Unremarkable sonographic evaluation of the right carotid system.   Electronically Signed   By: Sandi Mariscal M.D.   On: 10/28/2016 16:02 --------------------------------------  Last cholesterol; not eating eggs, not eating fatty pork meat; all beef hot dogs No bacon; eats some cheese Lab Results  Component Value Date   CHOL 199 02/25/2016   CHOL 201 (H) 12/26/2014   Lab Results  Component Value Date   HDL 61 02/25/2016   HDL 57 12/26/2014   Lab Results  Component Value Date   LDLCALC 105 02/25/2016   LDLCALC 116 (H) 12/26/2014   Lab Results  Component Value Date   TRIG 167 (H) 02/25/2016   TRIG 142 12/26/2014   Lab Results  Component Value Date   CHOLHDL 3.3 02/25/2016   No results found for: LDLDIRECT  Chronic insomnia; started up after thyroid trouble developed and heart palpitations; on the medicine, no heart palpitations She wishes to continue the medicine; no alcohol She is weaning the clonazepam; cutting it in half two days a week She is working on this with a little difficulty; just got brand new mattress  Depression screen [facility] 2/9 11/03/2016 08/18/2016 08/08/2016 05/17/2016 02/05/2016  Decreased Interest 0 0 0 0 0  Down, Depressed, Hopeless 0 0 0 0 0  PHQ - 2  Score 0 0 0 0 0    Relevant past medical, surgical, family and social history reviewed Past Medical History:  Diagnosis Date  . Abdominal pain   . Allergy   . Anxiety   . Arthritis    in hands  . Asthma   . Back pain   . Breast mass 08/27/2014   bilat 3:00 lumps  . Cancer ([place])    skin ca  . Carotid atherosclerosis 11/03/2016   LEFT on [place] July 2018  . Cataract   . COPD (chronic obstructive pulmonary disease) ([place])   . Elevated alkaline phosphatase level   . Elevated serum glutamic pyruvic transaminase (SGPT) level   . GERD (gastroesophageal reflux disease)   . Hemorrhagic shock [facility]) Aug 2015   s/p liver biopsy  . 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 History of hip fracture   . History of liver biopsy Aug 5188   with complications, acute hemorragic shock  . Hyperalbuminemia   . Hyperlipidemia   . Hypertension   . Hypokalemia   . Hypothyroid   . Insomnia 11/10/2015  . Irritable bowel syndrome 11/10/2015  . Osteoporosis   . Pleurisy   . Right hip pain   . Short leg syndrome, acquired   . Thrombopenia [facility])    Past Surgical History:  Procedure Laterality Date  .  APPENDECTOMY  1963  . CATARACT EXTRACTION  1989  . COLONOSCOPY WITH PROPOFOL N/A 09/25/2014   Procedure: COLONOSCOPY WITH PROPOFOL;  Surgeon: Josefine Class, MD;  Location[facility] ENDOSCOPY;  Service: Endoscopy;  Laterality: N/A;   Family History  Problem Relation Age of Onset  . Cancer Mother        skin  . Hyperlipidemia Mother   . Hypertension Mother   . Heart disease Mother   . Emphysema Father   . Lung disease Father   . Stroke Maternal Grandmother   . Diabetes Neg Hx   . COPD Neg Hx   . Hematuria Neg Hx   . Breast cancer Neg Hx    Social History   Social History  . Marital status: Married    Spouse name: N/A  . Number of children: N/A  . Years of education: N/A   Occupational History  . Not on file.   Social History Main Topics  . Smoking status: Never Smoker  . Smokeless  tobacco: Never Used  . Alcohol use No  . Drug use: No  . Sexual activity: Yes   Other Topics Concern  . Not on file   Social History Narrative  . No narrative on file    Interim medical history since last visit reviewed. Allergies and medications reviewed  Review of Systems Per HPI unless specifically indicated above     Objective:    BP 136/74 (BP Location: Left Arm, Patient Position: Sitting, Cuff Size: Normal)   Pulse 91   Temp 98.2 F (36.8 C) (Oral)   Resp 16   Ht 5\' 4"  (1.626 m)   Wt 126 lb (57.2 kg)   SpO2 93%   BMI 21.63 kg/m   Wt Readings from Last 3 Encounters:  11/03/16 126 lb (57.2 kg)  08/18/16 128 lb 14.4 oz (58.5 kg)  08/08/16 131 lb 12.8 oz (59.8 kg)    Physical Exam  Constitutional: She appears well-developed and well-nourished.  HENT:  Mouth/Throat: Mucous membranes are normal.  Eyes: EOM are normal. No scleral icterus.  Neck: No thyroid mass and no thyromegaly present.  Cardiovascular: Normal rate and regular rhythm.   Pulmonary/Chest: Effort normal and breath sounds normal.  Skin: She is not diaphoretic. No pallor.  Thinning hair on dome of scalp; no visible breakage  Psychiatric: She has a normal mood and affect. Her behavior is normal.   Results for orders placed or performed in visit on 08/08/16  TSH  Result Value Ref Range   TSH 2.65 mIU/L      Assessment & Plan:   Problem List Items Addressed This Visit      Cardiovascular and Mediastinum   Carotid atherosclerosis    Patient cannot take aspirin; politely declined referral to vascular; check another [place] in 6 months (Jan 2019); goal LDL is now under 70      Relevant Medications   amLODipine (NORVASC) 5 MG tablet   atorvastatin (LIPITOR) 20 MG tablet   Other Relevant Orders   US Carotid Bilateral     Other   Thrombocytopenia (HCC) (Chronic)    Check in 6 weeks; no bleeding; continues to bruise unchanged      Hyperlipidemia (Chronic)    New goal LDL less than 70; start  statin, recheck in 6 weeks      Relevant Medications   amLODipine (NORVASC) 5 MG tablet   atorvastatin (LIPITOR) 20 MG tablet   Elevated serum glutamic pyruvic transaminase (SGPT) level    Check labs  in 6 weeks; stop statin and call if any problems      Controlled substance agreement signed    Reviewed the [employer] web site over last 12 monhts, no red flags; no concern for misuse or diversion;r efills provided return in 3 months          Follow up plan: Return in about 3 months (around 02/03/2017) for follow-up visit with Dr. Sanda Klein; 6 weeks for fasting labs only.  An after-visit summary was printed and given to the patient at [place].  Please see the patient instructions which may contain other information and recommendations beyond what is mentioned above in the assessment and plan.  Meds ordered this encounter  Medications  . amLODipine (NORVASC) 5 MG tablet    Sig: Take 1 tablet (5 mg total) by mouth daily.    Dispense:  90 tablet    Refill:  3    Increasing the dose  . atorvastatin (LIPITOR) 20 MG tablet    Sig: Take 1 tablet (20 mg total) by mouth at bedtime.    Dispense:  30 tablet    Refill:  1  . clonazePAM (KLONOPIN) 0.5 MG tablet    Sig: Take 1 tablet (0.5 mg total) by mouth at bedtime as needed.    Dispense:  30 tablet    Refill:  2    [facility]    Orders Placed This Encounter  Procedures  . US Carotid Bilateral

## 2016-11-03 NOTE — Patient Instructions (Addendum)
Start the new medicine for cholesterol Return for fasting labs only six weeks after starting the medicine Try to limit saturated fats in your diet (bologna, hot dogs, barbeque, cheeseburgers, hamburgers, steak, bacon, sausage, cheese, etc.) and get more fresh fruits, vegetables, and whole grains Return in 3 months

## 2016-11-03 NOTE — Assessment & Plan Note (Signed)
Check labs in 6 weeks; stop statin and call if any problems

## 2016-11-03 NOTE — Assessment & Plan Note (Signed)
Reviewed the [employer] web site over last 12 monhts, no red flags; no concern for misuse or diversion;r efills provided return in 3 months

## 2016-11-03 NOTE — Assessment & Plan Note (Signed)
Patient cannot take aspirin; politely declined referral to vascular; check another [place] in 6 months (Jan 2019); goal LDL is now under 70

## 2016-11-03 NOTE — Assessment & Plan Note (Signed)
Check in 6 weeks; no bleeding; continues to bruise unchanged

## 2016-11-05 ENCOUNTER — Other Ambulatory Visit: Payer: Self-pay | Admitting: Family Medicine

## 2016-11-18 DIAGNOSIS — L821 Other seborrheic keratosis: Secondary | ICD-10-CM | POA: Diagnosis not present

## 2016-11-18 DIAGNOSIS — D485 Neoplasm of uncertain behavior of skin: Secondary | ICD-10-CM | POA: Diagnosis not present

## 2016-11-18 DIAGNOSIS — D225 Melanocytic nevi of trunk: Secondary | ICD-10-CM | POA: Diagnosis not present

## 2016-11-18 DIAGNOSIS — D044 Carcinoma in situ of skin of scalp and neck: Secondary | ICD-10-CM | POA: Diagnosis not present

## 2016-11-18 DIAGNOSIS — D2272 Melanocytic nevi of left lower limb, including hip: Secondary | ICD-10-CM | POA: Diagnosis not present

## 2016-11-18 DIAGNOSIS — D2261 Melanocytic nevi of right upper limb, including shoulder: Secondary | ICD-10-CM | POA: Diagnosis not present

## 2016-12-14 ENCOUNTER — Other Ambulatory Visit
Admission: RE | Admit: 2016-12-14 | Discharge: 2016-12-14 | Disposition: A | Discharge status: Home / Self Care | Payer: Medicare Other | Source: Ambulatory Visit | Attending: Student | Admitting: Student

## 2016-12-14 DIAGNOSIS — E039 Hypothyroidism, unspecified: Secondary | ICD-10-CM | POA: Diagnosis not present

## 2016-12-14 DIAGNOSIS — Z8719 Personal history of other diseases of the digestive system: Secondary | ICD-10-CM | POA: Diagnosis not present

## 2016-12-14 DIAGNOSIS — K760 Fatty (change of) liver, not elsewhere classified: Secondary | ICD-10-CM | POA: Diagnosis not present

## 2016-12-14 DIAGNOSIS — R194 Change in bowel habit: Secondary | ICD-10-CM | POA: Diagnosis not present

## 2016-12-14 DIAGNOSIS — R1084 Generalized abdominal pain: Secondary | ICD-10-CM | POA: Diagnosis not present

## 2016-12-14 DIAGNOSIS — R140 Abdominal distension (gaseous): Secondary | ICD-10-CM | POA: Diagnosis not present

## 2016-12-20 LAB — MISCELLANEOUS TEST

## 2016-12-21 DIAGNOSIS — D044 Carcinoma in situ of skin of scalp and neck: Secondary | ICD-10-CM | POA: Diagnosis not present

## 2016-12-21 DIAGNOSIS — L814 Other melanin hyperpigmentation: Secondary | ICD-10-CM | POA: Diagnosis not present

## 2016-12-21 DIAGNOSIS — L908 Other atrophic disorders of skin: Secondary | ICD-10-CM | POA: Diagnosis not present

## 2016-12-21 DIAGNOSIS — L578 Other skin changes due to chronic exposure to nonionizing radiation: Secondary | ICD-10-CM | POA: Diagnosis not present

## 2017-01-05 DIAGNOSIS — Z23 Encounter for immunization: Secondary | ICD-10-CM | POA: Diagnosis not present

## 2017-01-05 DIAGNOSIS — M5137 Other intervertebral disc degeneration, lumbosacral region: Secondary | ICD-10-CM | POA: Diagnosis not present

## 2017-01-05 DIAGNOSIS — Z8639 Personal history of other endocrine, nutritional and metabolic disease: Secondary | ICD-10-CM | POA: Diagnosis not present

## 2017-01-05 DIAGNOSIS — I10 Essential (primary) hypertension: Secondary | ICD-10-CM | POA: Diagnosis not present

## 2017-01-05 DIAGNOSIS — E784 Other hyperlipidemia: Secondary | ICD-10-CM | POA: Diagnosis not present

## 2017-01-06 DIAGNOSIS — Z23 Encounter for immunization: Secondary | ICD-10-CM | POA: Diagnosis not present

## 2017-01-06 DIAGNOSIS — E611 Iron deficiency: Secondary | ICD-10-CM | POA: Diagnosis not present

## 2017-01-06 DIAGNOSIS — E784 Other hyperlipidemia: Secondary | ICD-10-CM | POA: Diagnosis not present

## 2017-01-06 DIAGNOSIS — E559 Vitamin D deficiency, unspecified: Secondary | ICD-10-CM | POA: Diagnosis not present

## 2017-01-06 DIAGNOSIS — Z8639 Personal history of other endocrine, nutritional and metabolic disease: Secondary | ICD-10-CM | POA: Diagnosis not present

## 2017-01-06 DIAGNOSIS — J4541 Moderate persistent asthma with (acute) exacerbation: Secondary | ICD-10-CM | POA: Diagnosis not present

## 2017-01-06 DIAGNOSIS — E538 Deficiency of other specified B group vitamins: Secondary | ICD-10-CM | POA: Diagnosis not present

## 2017-01-18 ENCOUNTER — Other Ambulatory Visit: Payer: Self-pay | Admitting: Family Medicine

## 2017-01-18 DIAGNOSIS — Z1231 Encounter for screening mammogram for malignant neoplasm of breast: Secondary | ICD-10-CM

## 2017-01-20 DIAGNOSIS — R05 Cough: Secondary | ICD-10-CM | POA: Diagnosis not present

## 2017-01-20 DIAGNOSIS — J439 Emphysema, unspecified: Secondary | ICD-10-CM | POA: Diagnosis not present

## 2017-01-26 ENCOUNTER — Ambulatory Visit
Admission: RE | Admit: 2017-01-26 | Discharge: 2017-01-26 | Disposition: A | Discharge status: Home / Self Care | Payer: Medicare Other | Source: Ambulatory Visit | Attending: Family Medicine | Admitting: Family Medicine

## 2017-01-26 DIAGNOSIS — Z1231 Encounter for screening mammogram for malignant neoplasm of breast: Secondary | ICD-10-CM | POA: Diagnosis not present

## 2017-02-06 ENCOUNTER — Telehealth: Payer: Self-pay | Admitting: Family Medicine

## 2017-03-23 DIAGNOSIS — Z1389 Encounter for screening for other disorder: Secondary | ICD-10-CM | POA: Diagnosis not present

## 2017-03-23 DIAGNOSIS — Z9181 History of falling: Secondary | ICD-10-CM | POA: Diagnosis not present

## 2017-03-23 DIAGNOSIS — Z6820 Body mass index (BMI) 20.0-20.9, adult: Secondary | ICD-10-CM | POA: Diagnosis not present

## 2017-03-23 DIAGNOSIS — Z0000 Encounter for general adult medical examination without abnormal findings: Secondary | ICD-10-CM | POA: Diagnosis not present

## 2017-03-23 IMAGING — US ABDOMEN ULTRASOUND
1 series · 13 of 25 positions shown · non-contrast
Comparison: CT abdomen and pelvis December 11, 2013

CLINICAL DATA: Left upper quadrant pain and thrombocytopenia

EXAM:
ULTRASOUND ABDOMEN COMPLETE

[Series 1: abdomen ultrasound · 0.20mm/px · 13 of 170 slices shown]
[im 1/170]
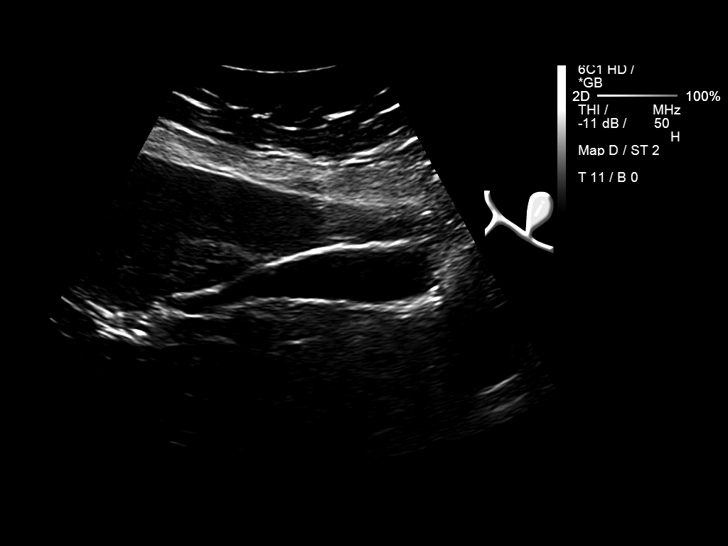
[im 15/170]
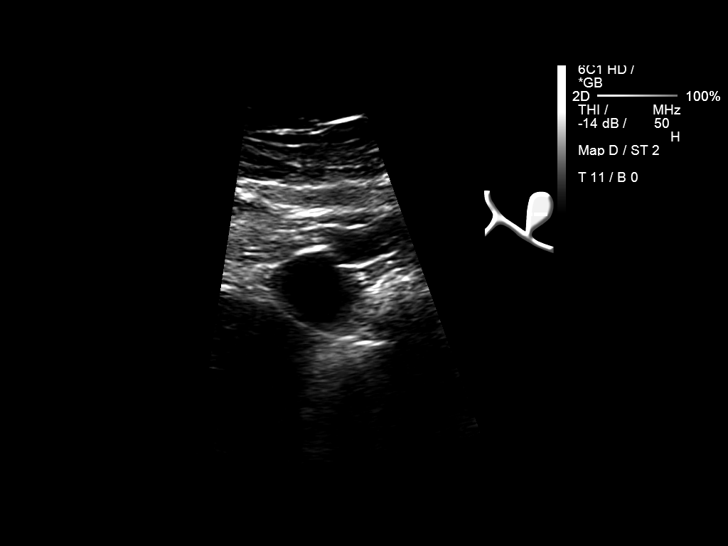
[im 29/170]
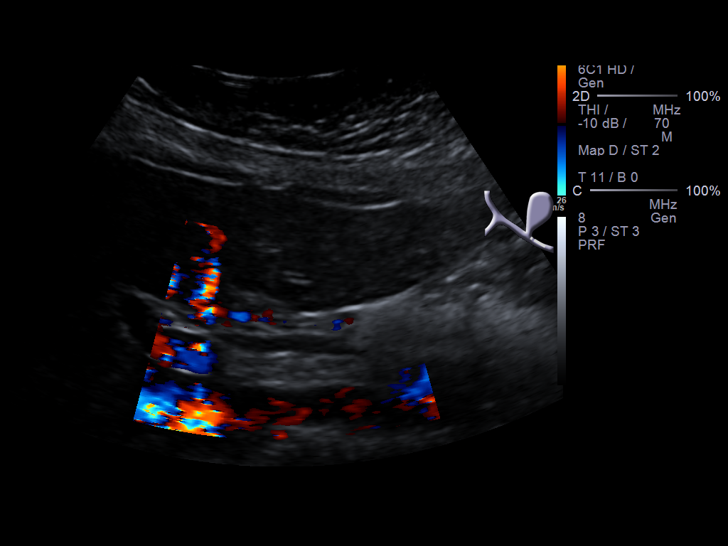
[im 43/170]
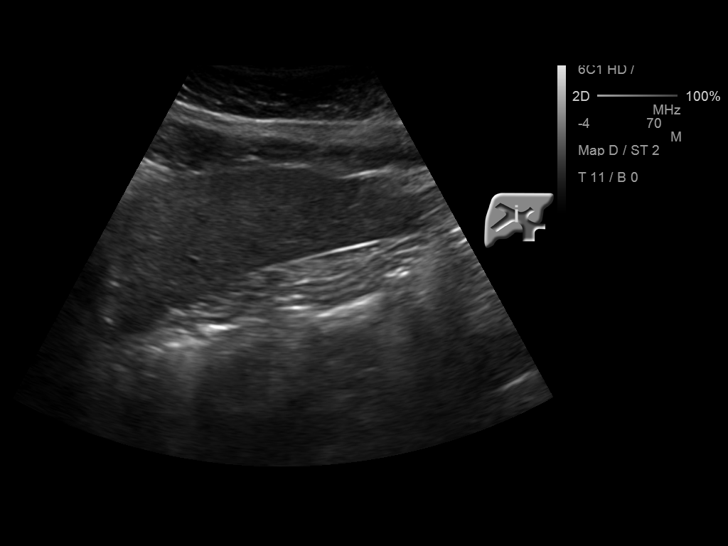
[im 57/170]
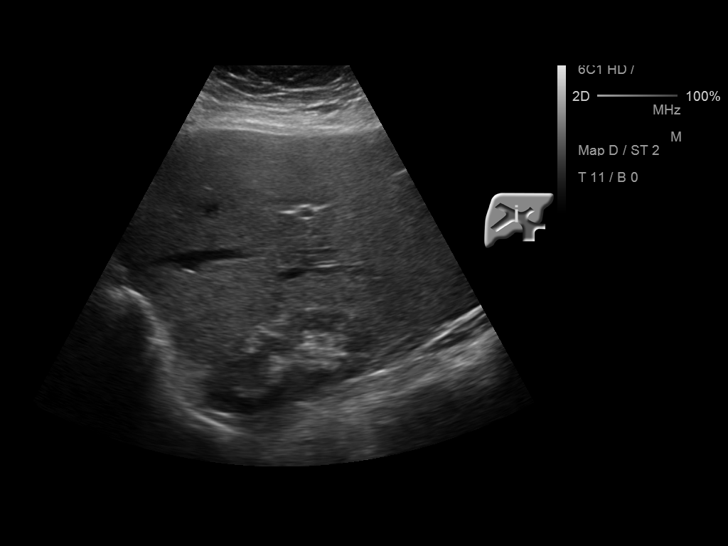
[im 71/170]
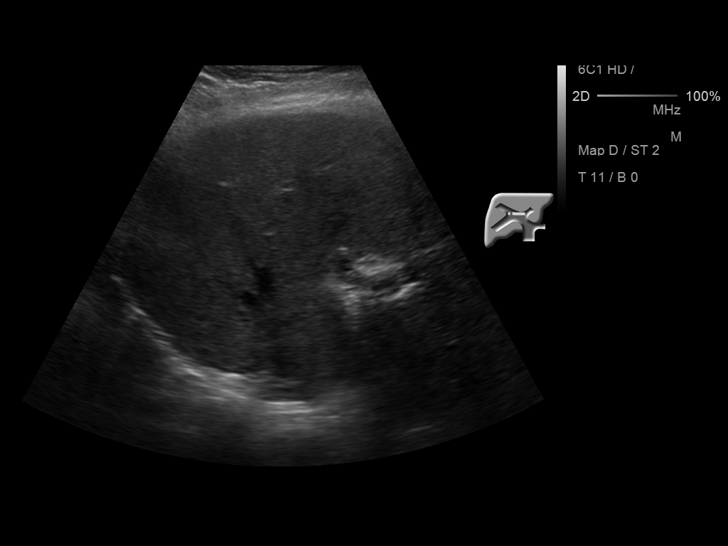
[im 85/170]
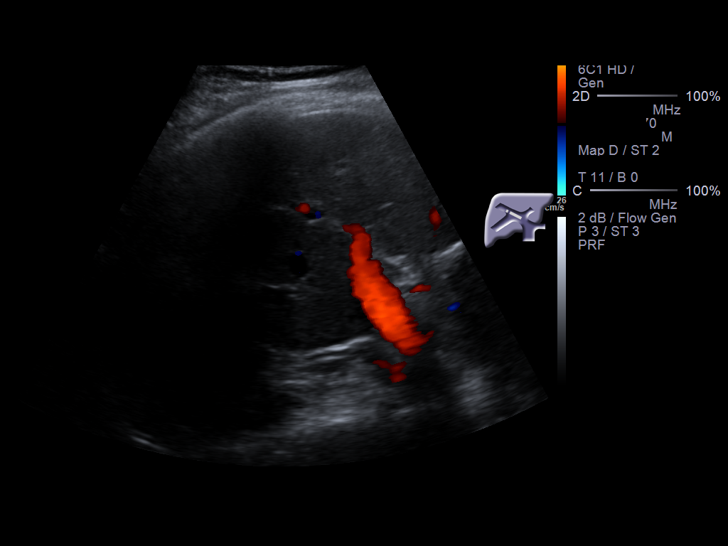
[im 99/170]
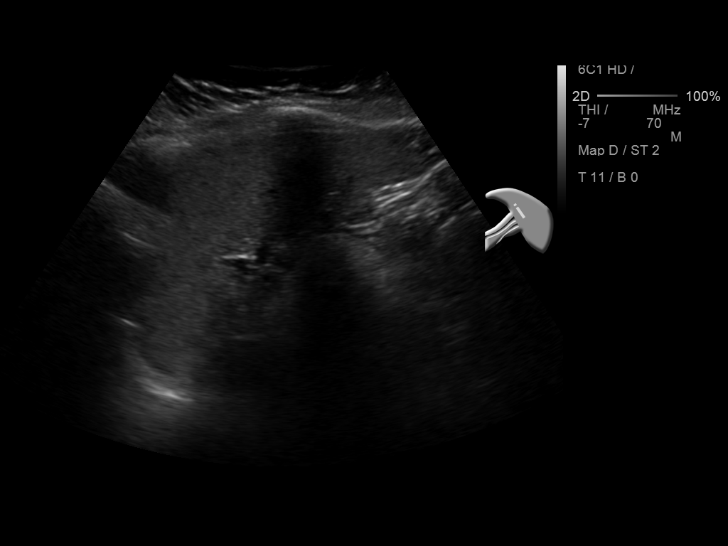
[im 113/170]
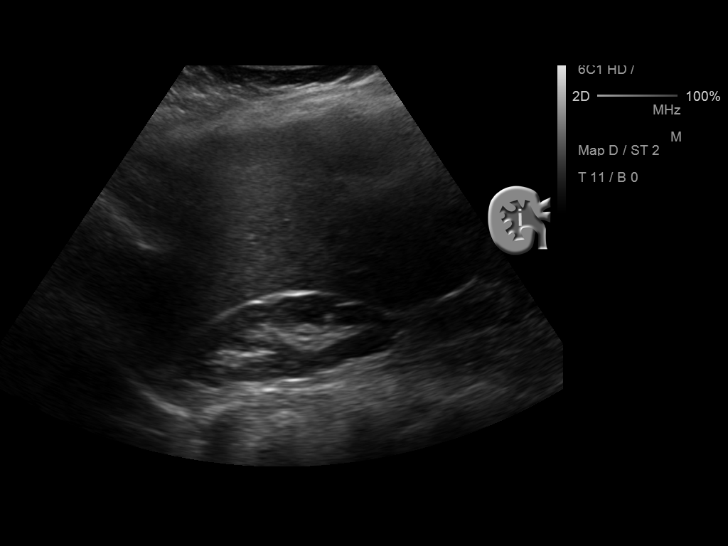
[im 127/170]
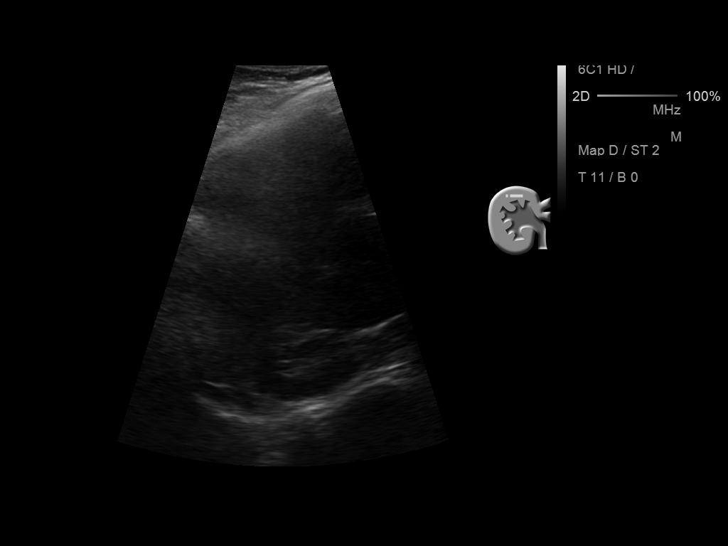
[im 141/170]
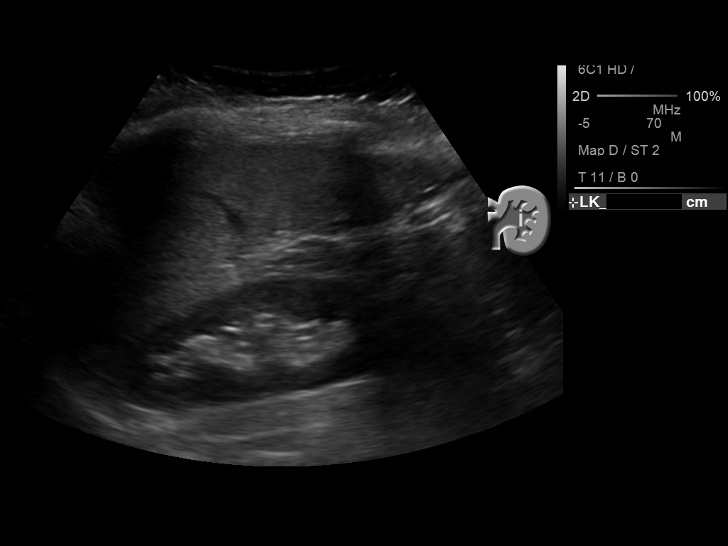
[im 155/170]
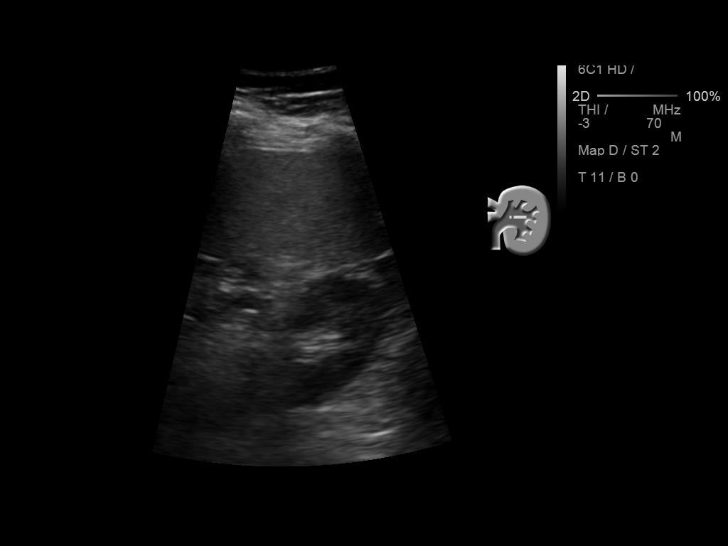
[im 170/170]
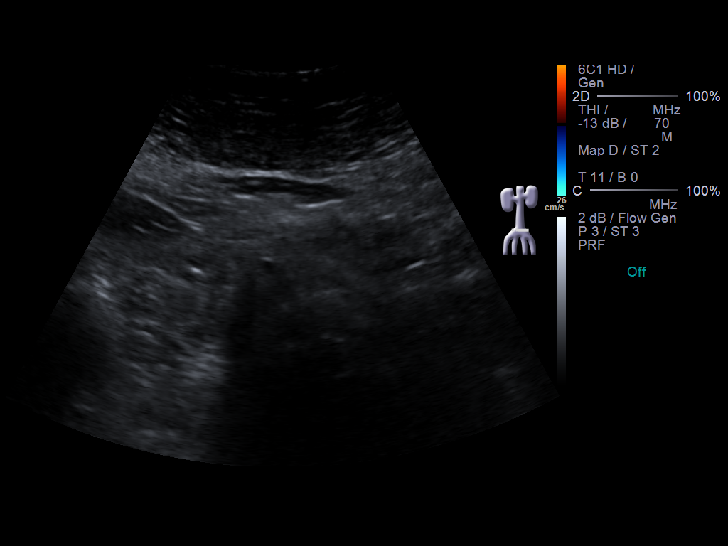

[13 of 25 positions shown; findings below may reference images not displayed]

FINDINGS: Gallbladder: No gallstones or wall thickening visualized. There is
no pericholecystic fluid. No sonographic Murphy sign noted.

Common bile duct: Diameter: 3 mm. There is no intrahepatic, common
hepatic, or common bile duct dilatation.

Liver: No focal lesion identified. The liver contour is subtly
nodular, similar to prior CT. Liver echogenicity is mildly increased
diffusely. Liver is prominent measuring 20.6 cm in length.

IVC: No abnormality visualized.

Pancreas: No mass or inflammatory focus.

Spleen: Size and appearance within normal limits. Spleen measures
8.5 x 9.7 x 4.0 cm in size.

Right Kidney: Length: 7.9 cm. Echogenicity within normal limits. No
mass or hydronephrosis visualized.

Left Kidney: Length: 8.5 cm. Echogenicity within normal limits. No
mass or hydronephrosis visualized.

Abdominal aorta: No aneurysm visualized.

Other findings: No demonstrable ascites.
IMPRESSION: Spleen is normal in size and contour. No splenic lesions are
identified. No perisplenic fluid.

Liver is prominent with a subtly nodular contour and increased
echogenicity. Question underlying parenchymal disease. There may
also be a degree of hepatic steatosis given the increased
echogenicity. No focal liver lesions are identified. It should be
noted that the sensitivity of ultrasound for focal liver lesions is
diminished in this circumstance.

Small kidneys. Question underlying medical renal disease. No
obstructing foci identified in either kidney.

Study otherwise unremarkable.

## 2017-03-24 DIAGNOSIS — E611 Iron deficiency: Secondary | ICD-10-CM | POA: Diagnosis not present

## 2017-03-24 DIAGNOSIS — E559 Vitamin D deficiency, unspecified: Secondary | ICD-10-CM | POA: Diagnosis not present

## 2017-03-24 DIAGNOSIS — Z8639 Personal history of other endocrine, nutritional and metabolic disease: Secondary | ICD-10-CM | POA: Diagnosis not present

## 2017-03-24 DIAGNOSIS — E538 Deficiency of other specified B group vitamins: Secondary | ICD-10-CM | POA: Diagnosis not present

## 2017-03-24 DIAGNOSIS — E7849 Other hyperlipidemia: Secondary | ICD-10-CM | POA: Diagnosis not present

## 2017-04-12 DIAGNOSIS — H209 Unspecified iridocyclitis: Secondary | ICD-10-CM | POA: Diagnosis not present

## 2017-05-02 DIAGNOSIS — K529 Noninfective gastroenteritis and colitis, unspecified: Secondary | ICD-10-CM | POA: Diagnosis not present

## 2017-05-02 DIAGNOSIS — R1032 Left lower quadrant pain: Secondary | ICD-10-CM | POA: Diagnosis not present

## 2017-05-03 ENCOUNTER — Other Ambulatory Visit: Payer: Self-pay | Admitting: Gastroenterology

## 2017-05-03 DIAGNOSIS — R1032 Left lower quadrant pain: Secondary | ICD-10-CM

## 2017-05-03 DIAGNOSIS — K529 Noninfective gastroenteritis and colitis, unspecified: Secondary | ICD-10-CM

## 2017-05-04 NOTE — Telephone Encounter (Signed)
duplicate

## 2017-05-05 ENCOUNTER — Other Ambulatory Visit
Admission: RE | Admit: 2017-05-05 | Discharge: 2017-05-05 | Disposition: A | Discharge status: Home / Self Care | Payer: Medicare Other | Source: Ambulatory Visit | Attending: Gastroenterology | Admitting: Gastroenterology

## 2017-05-05 DIAGNOSIS — K529 Noninfective gastroenteritis and colitis, unspecified: Secondary | ICD-10-CM | POA: Insufficient documentation

## 2017-05-05 LAB — GASTROINTESTINAL PANEL BY PCR, STOOL (REPLACES STOOL CULTURE)

## 2017-05-05 LAB — C DIFFICILE QUICK SCREEN W PCR REFLEX
C Diff antigen: NEGATIVE
C Diff interpretation: NOT DETECTED
C Diff toxin: NEGATIVE

## 2017-05-12 ENCOUNTER — Ambulatory Visit
Admission: RE | Admit: 2017-05-12 | Discharge: 2017-05-12 | Disposition: A | Discharge status: Home / Self Care | Payer: Medicare Other | Source: Ambulatory Visit | Attending: Gastroenterology | Admitting: Gastroenterology

## 2017-05-12 DIAGNOSIS — R1032 Left lower quadrant pain: Secondary | ICD-10-CM | POA: Diagnosis not present

## 2017-05-12 DIAGNOSIS — K529 Noninfective gastroenteritis and colitis, unspecified: Secondary | ICD-10-CM | POA: Diagnosis not present

## 2017-05-12 LAB — POCT I-STAT CREATININE: CREATININE: 0.9 mg/dL (ref 0.44–1.00)

## 2017-05-12 MED ORDER — IOPAMIDOL (ISOVUE-300) INJECTION 61%
80.0000 mL | Freq: Once | INTRAVENOUS | Status: AC | PRN
  Administered 2017-05-12: 80 mL via INTRAVENOUS

## 2017-05-31 NOTE — Telephone Encounter (Signed)
Visit completed.

## 2017-06-10 NOTE — Progress Notes (Signed)
Closing out scanned document note open since  April 2018 I wet signed this back in April 2018

## 2017-06-10 NOTE — Progress Notes (Signed)
Closing out lab/order note open since:  12/2016

## 2017-06-10 NOTE — Progress Notes (Signed)
Closing out lab/order note open since:  Nov 2017

## 2017-06-10 NOTE — Progress Notes (Signed)
Closing out scanned document note open since  Aug 2017 I wet signed this in 2017

## 2017-10-13 ENCOUNTER — Other Ambulatory Visit: Payer: Self-pay | Admitting: Family Medicine

## 2017-10-13 NOTE — Telephone Encounter (Signed)
Please contact patient; last visit was July 2018 and no upcoming appointments I've refilled her medicine, but we'd like to ask her to schedule an appointment to see me in the next 4-6 weeks Also, schedule with Ammie for AWV for Medicare Thank you

## 2017-10-13 NOTE — Telephone Encounter (Signed)
Called [phone] left voice message informing pt that prescription has been sent to pharmacy also for patient to schedule an appt within 4-6 weeks. Also pt is needing to schedule appt for medicare wellness with nurse health advisors

## 2017-11-09 DIAGNOSIS — J439 Emphysema, unspecified: Secondary | ICD-10-CM | POA: Diagnosis not present

## 2017-11-09 DIAGNOSIS — J449 Chronic obstructive pulmonary disease, unspecified: Secondary | ICD-10-CM | POA: Diagnosis not present

## 2017-12-04 DIAGNOSIS — Z8639 Personal history of other endocrine, nutritional and metabolic disease: Secondary | ICD-10-CM | POA: Diagnosis not present

## 2017-12-04 DIAGNOSIS — E039 Hypothyroidism, unspecified: Secondary | ICD-10-CM | POA: Diagnosis not present

## 2017-12-04 DIAGNOSIS — M810 Age-related osteoporosis without current pathological fracture: Secondary | ICD-10-CM | POA: Diagnosis not present

## 2017-12-04 DIAGNOSIS — K219 Gastro-esophageal reflux disease without esophagitis: Secondary | ICD-10-CM | POA: Diagnosis not present

## 2017-12-06 DIAGNOSIS — M810 Age-related osteoporosis without current pathological fracture: Secondary | ICD-10-CM | POA: Diagnosis not present

## 2018-01-08 DIAGNOSIS — D2272 Melanocytic nevi of left lower limb, including hip: Secondary | ICD-10-CM | POA: Diagnosis not present

## 2018-01-08 DIAGNOSIS — L820 Inflamed seborrheic keratosis: Secondary | ICD-10-CM | POA: Diagnosis not present

## 2018-01-08 DIAGNOSIS — L298 Other pruritus: Secondary | ICD-10-CM | POA: Diagnosis not present

## 2018-01-08 DIAGNOSIS — L570 Actinic keratosis: Secondary | ICD-10-CM | POA: Diagnosis not present

## 2018-01-08 DIAGNOSIS — D2262 Melanocytic nevi of left upper limb, including shoulder: Secondary | ICD-10-CM | POA: Diagnosis not present

## 2018-01-08 DIAGNOSIS — L538 Other specified erythematous conditions: Secondary | ICD-10-CM | POA: Diagnosis not present

## 2018-01-08 DIAGNOSIS — X32XXXA Exposure to sunlight, initial encounter: Secondary | ICD-10-CM | POA: Diagnosis not present

## 2018-01-08 DIAGNOSIS — D2261 Melanocytic nevi of right upper limb, including shoulder: Secondary | ICD-10-CM | POA: Diagnosis not present

## 2018-01-08 DIAGNOSIS — Z08 Encounter for follow-up examination after completed treatment for malignant neoplasm: Secondary | ICD-10-CM | POA: Diagnosis not present

## 2018-01-08 DIAGNOSIS — Z85828 Personal history of other malignant neoplasm of skin: Secondary | ICD-10-CM | POA: Diagnosis not present

## 2018-01-18 ENCOUNTER — Other Ambulatory Visit: Payer: Self-pay | Admitting: Family Medicine

## 2018-01-19 ENCOUNTER — Other Ambulatory Visit: Payer: Self-pay | Admitting: Physician Assistant

## 2018-01-19 DIAGNOSIS — E559 Vitamin D deficiency, unspecified: Secondary | ICD-10-CM | POA: Diagnosis not present

## 2018-01-19 DIAGNOSIS — Z8639 Personal history of other endocrine, nutritional and metabolic disease: Secondary | ICD-10-CM | POA: Diagnosis not present

## 2018-01-19 DIAGNOSIS — Z1231 Encounter for screening mammogram for malignant neoplasm of breast: Secondary | ICD-10-CM

## 2018-01-19 DIAGNOSIS — E7849 Other hyperlipidemia: Secondary | ICD-10-CM | POA: Diagnosis not present

## 2018-01-19 DIAGNOSIS — E611 Iron deficiency: Secondary | ICD-10-CM | POA: Diagnosis not present

## 2018-01-19 DIAGNOSIS — E538 Deficiency of other specified B group vitamins: Secondary | ICD-10-CM | POA: Diagnosis not present

## 2018-01-19 DIAGNOSIS — R739 Hyperglycemia, unspecified: Secondary | ICD-10-CM | POA: Diagnosis not present

## 2018-02-09 DIAGNOSIS — J441 Chronic obstructive pulmonary disease with (acute) exacerbation: Secondary | ICD-10-CM | POA: Diagnosis not present

## 2018-02-09 DIAGNOSIS — E538 Deficiency of other specified B group vitamins: Secondary | ICD-10-CM | POA: Diagnosis not present

## 2018-02-09 DIAGNOSIS — M1990 Unspecified osteoarthritis, unspecified site: Secondary | ICD-10-CM | POA: Diagnosis not present

## 2018-02-09 DIAGNOSIS — Z23 Encounter for immunization: Secondary | ICD-10-CM | POA: Diagnosis not present

## 2018-02-09 DIAGNOSIS — Z8639 Personal history of other endocrine, nutritional and metabolic disease: Secondary | ICD-10-CM | POA: Diagnosis not present

## 2018-02-09 DIAGNOSIS — H6123 Impacted cerumen, bilateral: Secondary | ICD-10-CM | POA: Diagnosis not present

## 2018-03-01 ENCOUNTER — Ambulatory Visit
Admission: RE | Admit: 2018-03-01 | Discharge: 2018-03-01 | Disposition: A | Discharge status: Home / Self Care | Payer: Medicare Other | Source: Ambulatory Visit | Attending: Physician Assistant | Admitting: Physician Assistant

## 2018-03-01 DIAGNOSIS — Z1231 Encounter for screening mammogram for malignant neoplasm of breast: Secondary | ICD-10-CM | POA: Diagnosis not present

## 2018-03-13 DIAGNOSIS — H1013 Acute atopic conjunctivitis, bilateral: Secondary | ICD-10-CM | POA: Diagnosis not present

## 2018-03-16 DIAGNOSIS — L03213 Periorbital cellulitis: Secondary | ICD-10-CM | POA: Diagnosis not present

## 2018-03-27 DIAGNOSIS — L03213 Periorbital cellulitis: Secondary | ICD-10-CM | POA: Diagnosis not present

## 2018-04-10 DIAGNOSIS — J449 Chronic obstructive pulmonary disease, unspecified: Secondary | ICD-10-CM | POA: Diagnosis not present

## 2019-06-20 ENCOUNTER — Ambulatory Visit: Payer: Medicare Other | Attending: Internal Medicine

## 2019-06-20 DIAGNOSIS — Z23 Encounter for immunization: Secondary | ICD-10-CM | POA: Insufficient documentation

## 2019-06-20 NOTE — Progress Notes (Signed)
   Covid-19 Vaccination Clinic  Name:  Anna Lowe    MRN: [id number] DOB: 1945/12/14  06/20/2019  Anna Lowe was observed post Covid-19 immunization for 15 minutes without incidence. She was provided with Vaccine Information Sheet and instruction to access the V-Safe system.   Anna Lowe was instructed to call 911 with any severe reactions post vaccine: Marland Kitchen Difficulty breathing  . Swelling of your face and throat  . A fast heartbeat  . A bad rash all over your body  . Dizziness and weakness    Immunizations Administered    Name Date Dose VIS Date Route   Pfizer COVID-19 Vaccine 06/20/2019  8:46 AM 0.3 mL 04/05/2019 Intramuscular   Manufacturer: [place]   Lot: [id number]   [place]: [id number]

## 2019-07-17 ENCOUNTER — Ambulatory Visit: Payer: Medicare Other | Attending: Internal Medicine

## 2019-07-17 DIAGNOSIS — Z23 Encounter for immunization: Secondary | ICD-10-CM

## 2019-07-17 NOTE — Progress Notes (Signed)
   Covid-19 Vaccination Clinic  Name:  Anna Lowe    MRN: [id number] DOB: 1946/04/02  07/17/2019  [name] was observed post Covid-19 immunization for 15 minutes without incident. She was provided with Vaccine Information Sheet and instruction to access the V-Safe system.   [name] was instructed to call 911 with any severe reactions post vaccine: Marland Kitchen Difficulty breathing  . Swelling of face and throat  . A fast heartbeat  . A bad rash all over body  . Dizziness and weakness   Immunizations Administered    Name Date Dose VIS Date Route   Pfizer COVID-19 Vaccine 07/17/2019  8:13 AM 0.3 mL 04/05/2019 Intramuscular   Manufacturer: [employer]   Lot: [id number]   [place]: [id number]
